# Patient Record
Sex: Female | Born: 1970
Health system: Southern US, Community
[De-identification: ages and names within clinical notes are randomized; demographics above are authoritative.]

## PROBLEM LIST (undated history)

## (undated) DIAGNOSIS — C44511 Basal cell carcinoma of skin of breast: Secondary | ICD-10-CM

## (undated) DIAGNOSIS — J45909 Unspecified asthma, uncomplicated: Secondary | ICD-10-CM

## (undated) DIAGNOSIS — K219 Gastro-esophageal reflux disease without esophagitis: Secondary | ICD-10-CM

## (undated) DIAGNOSIS — E039 Hypothyroidism, unspecified: Secondary | ICD-10-CM

## (undated) DIAGNOSIS — J302 Other seasonal allergic rhinitis: Secondary | ICD-10-CM

## (undated) DIAGNOSIS — E079 Disorder of thyroid, unspecified: Secondary | ICD-10-CM

## (undated) HISTORY — PX: OTHER SURGICAL HISTORY: SHX169

## (undated) HISTORY — PX: CLAVICLE SURGERY: SHX598

## (undated) HISTORY — PX: TONSILLECTOMY: SUR1361

## (undated) HISTORY — DX: Disorder of thyroid, unspecified: E07.9

## (undated) HISTORY — PX: CHOLECYSTECTOMY: SHX55

## (undated) HISTORY — PX: NASAL SINUS SURGERY: SHX719

## (undated) HISTORY — PX: BREAST ENHANCEMENT SURGERY: SHX7

## (undated) HISTORY — PX: AUGMENTATION MAMMAPLASTY: SUR837

---

## 2001-11-30 ENCOUNTER — Emergency Department (HOSPITAL_COMMUNITY): Admission: EM | Admit: 2001-11-30 | Discharge: 2001-11-30 | Payer: Self-pay | Admitting: *Deleted

## 2002-08-08 ENCOUNTER — Encounter: Payer: Self-pay | Admitting: *Deleted

## 2002-08-08 ENCOUNTER — Emergency Department (HOSPITAL_COMMUNITY): Admission: EM | Admit: 2002-08-08 | Discharge: 2002-08-08 | Payer: Self-pay | Admitting: *Deleted

## 2002-11-16 ENCOUNTER — Encounter: Payer: Self-pay | Admitting: Obstetrics & Gynecology

## 2002-11-16 ENCOUNTER — Encounter: Admission: RE | Admit: 2002-11-16 | Discharge: 2002-11-16 | Payer: Self-pay | Admitting: Obstetrics & Gynecology

## 2003-11-19 ENCOUNTER — Encounter: Admission: RE | Admit: 2003-11-19 | Discharge: 2003-11-19 | Payer: Self-pay | Admitting: Internal Medicine

## 2003-12-12 ENCOUNTER — Other Ambulatory Visit: Admission: RE | Admit: 2003-12-12 | Discharge: 2003-12-12 | Payer: Self-pay | Admitting: Obstetrics and Gynecology

## 2004-02-06 ENCOUNTER — Ambulatory Visit (HOSPITAL_BASED_OUTPATIENT_CLINIC_OR_DEPARTMENT_OTHER): Admission: RE | Admit: 2004-02-06 | Discharge: 2004-02-06 | Payer: Self-pay | Admitting: Otolaryngology

## 2004-02-06 ENCOUNTER — Ambulatory Visit (HOSPITAL_COMMUNITY): Admission: RE | Admit: 2004-02-06 | Discharge: 2004-02-06 | Payer: Self-pay | Admitting: Otolaryngology

## 2004-02-06 ENCOUNTER — Encounter (INDEPENDENT_AMBULATORY_CARE_PROVIDER_SITE_OTHER): Payer: Self-pay | Admitting: *Deleted

## 2004-02-21 ENCOUNTER — Emergency Department (HOSPITAL_COMMUNITY): Admission: EM | Admit: 2004-02-21 | Discharge: 2004-02-22 | Payer: Self-pay | Admitting: Emergency Medicine

## 2004-12-27 ENCOUNTER — Encounter
Admission: RE | Admit: 2004-12-27 | Discharge: 2004-12-27 | Payer: Self-pay | Admitting: Physical Medicine and Rehabilitation

## 2005-05-29 ENCOUNTER — Inpatient Hospital Stay (HOSPITAL_COMMUNITY): Admission: AD | Admit: 2005-05-29 | Discharge: 2005-05-29 | Payer: Self-pay | Admitting: *Deleted

## 2007-01-06 ENCOUNTER — Encounter
Admission: RE | Admit: 2007-01-06 | Discharge: 2007-01-06 | Payer: Self-pay | Admitting: Physical Medicine and Rehabilitation

## 2007-08-15 ENCOUNTER — Ambulatory Visit (HOSPITAL_COMMUNITY): Admission: RE | Admit: 2007-08-15 | Discharge: 2007-08-15 | Payer: Self-pay | Admitting: Gastroenterology

## 2007-10-10 ENCOUNTER — Encounter (INDEPENDENT_AMBULATORY_CARE_PROVIDER_SITE_OTHER): Payer: Self-pay | Admitting: General Surgery

## 2007-10-10 ENCOUNTER — Ambulatory Visit (HOSPITAL_COMMUNITY): Admission: RE | Admit: 2007-10-10 | Discharge: 2007-10-11 | Payer: Self-pay | Admitting: General Surgery

## 2010-04-19 ENCOUNTER — Encounter: Payer: Self-pay | Admitting: Obstetrics & Gynecology

## 2010-08-11 NOTE — Op Note (Signed)
NAMESONIA, BROMELL              ACCOUNT NO.:  [id number]   MEDICAL RECORD NO.:  [id number]          PATIENT TYPE:  OIB   LOCATION:  0098                         FACILITY:  [facility]   PHYSICIAN:  Anselm Pancoast. Weatherly, M.D.DATE OF BIRTH:  06-13-1970   DATE OF PROCEDURE:  10/10/2007  DATE OF DISCHARGE:                               OPERATIVE REPORT   PREOPERATIVE DIAGNOSIS:  Biliary dyskinesia.   POSTOPERATIVE DIAGNOSIS:  Biliary dyskinesia.   OPERATION:  Laparoscopic cholecystectomy with cholangiogram.   SURGEON:  Anselm Pancoast. Zachery Dakins, M.D.   ASSISTANT:  Angelia Mould. Derrell Lolling, M.D.   General anesthesia.   HISTORY:  Kristy Walter is a 40 year old patient female who was  referred to me by Dr. Jeani Hawking for a symptomatic gallbladder.  The  patient has had a long history of esophageal reflux or those type  symptoms, but more recently, she has had more pain in the right upper  quadrant and had an ultrasound that did not show gallstones.  She had a  hepatobiliary scan with half and half stimulation that reproduced her  symptoms and had a low-ejection fraction.  I was asked to see her after  this test was done, and on listening to her symptoms, she said certainly  convinced me that it is certainly sounds as if it is biliary in nature,  and I was in agreement to proceed with the laparoscopic cholecystectomy  with cholangiogram.  She elected to have her gallbladder removed, and  whether her symptoms will be completely resolve or whether she will  still have the symptoms of esophageal reflux I am not sure.  The patient  is on Protonix daily, and her preoperative CBC and CMET were all within  normal limits.  The patient preoperatively was given 3 g of Unasyn.  She  has PAS stockings and was taken to the operative suite.  After the  abdomen had been prepped and induction general anesthesia and  endotracheal tube and an oral tube to the stomach, the abdomen was  draped in a sterile manner,  and a small incision was made below the  umbilicus.  The fascia was identified, picked up between 2 Kocher's and  very carefully opened through the fascia and in the peritoneum I  carefully entered through with a hemostat.  Pursestring suture of 0  Vicryl was placed and Hasson cannula introduced.  The gallbladder was  not acutely inflamed.  It was distended, and she has definitely has  adhesions around it.  The upper 10-mm trocar was placed under direct  vision in the subxiphoid area, and two lateral 5-mm trocars were placed  by Dr. Derrell Lolling with the appropriate position after anesthetizing the  fascia.  The adhesions to the gallbladder were carefully taken down, and  you could see where the gallbladder was located as it was kind of  kinking on itself, and there was very a marked angulation of the cystic  duct which was very short in nature.  The cystic artery was identified.  There was a little bit of bleeding as I went around it posteriorly, and  I put 2 clips  proximally, singly and distally and then the bleeding  appeared to be in control.  I then used the right-angle to carefully go  around the junction of the gallbladder cystic duct and put a clip on the  cystic duct gallbladder junction.  A small opening was made proximally.  The catheter was introduced, held in place with a clip, and the x-ray  was obtained.  There was good prompt filling of the extra biliary system  and appears that we got about a centimeter of cystic duct.  I removed  the catheter and put 3 clips on the cystic duct and then divided the  cystic duct distal to the 3 clips, then divided the anterior branch of  the cystic artery.  The dissection very carefully, and I think I  identified the posterior branch.  It was also clipped, and then when  this freed, you could remove the gallbladder easily.  Good hemostasis  was obtained.  The gallbladder was placed in the EndoCatch bag and  brought out within the bag with  withdrawing the Hassan cannula with the  camera and the upper 10-mm trocar.  I put my finger in the fascia.  We  irrigated, aspirated.  There was good hemostasis.  No evidence of any  bleeding or bile, and then I put an additional figure-of-eight of 0  Vicryl in the fascia at the umbilicus and anesthetized the fascia at the  umbilicus with Marcaine.  The other ports had been anesthetized with  insertion of the trocars.  Carbon dioxide was released after the  irrigating fluid had been withdrawn, and the subcutaneous wounds were  closed with 4-0 Vicryl.  Benzoin and Steri-Strips on the skin.  The  patient tolerated the procedure nicely and was sent to recovery room in  stable postoperative condition.  The patient wants to spend the night.  She will be able to go home tomorrow and should be able to return to  work in about 2 weeks or sooner if she desires.           ______________________________  Anselm Pancoast. Zachery Dakins, M.D.     WJW/MEDQ  D:  10/10/2007  T:  10/10/2007  Job:  [id number]   cc:   Anselm Pancoast. Zachery Dakins, M.D.  [address], [address]  [place]  [place] [address]   Kristy Walter, M.D.  Fax: [phone]

## 2010-08-14 NOTE — Op Note (Signed)
NAMEDAYTON, SHERR NO.:  [id number]   MEDICAL RECORD NO.:  [id number]          PATIENT TYPE:  AMB   LOCATION:  DSC                          FACILITY:  [facility]   PHYSICIAN:  Suzanna Obey, M.D.       DATE OF BIRTH:  02/20/1971   DATE OF PROCEDURE:  02/06/2004  DATE OF DISCHARGE:                                 OPERATIVE REPORT   PREOPERATIVE DIAGNOSIS:  Deviated septum, turbinate hypertrophy and  tonsillitis.   POSTOPERATIVE DIAGNOSIS:  Deviated septum, turbinate hypertrophy and  tonsillitis.   OPERATION PERFORMED:  Septoplasty, submucous resection of inferior  turbinates, and tonsillectomy.   SURGEON:  Suzanna Obey, M.D.   ANESTHESIA:  General endotracheal.   ESTIMATED BLOOD LOSS:  Approximately 25 mL.   INDICATIONS FOR PROCEDURE:  The patient is a 40 year old who has had chronic  nasal obstruction that has been refractory to medical therapy.  She has  significant issues with her nasal obstruction and has not resolved with  nasal steroid sprays.  She also has chronic tonsillitis with repetitive  episodes and cryptic debris which is very bothersome.  She was informed of  the risks and benefits of the procedure, including bleeding, infection,  septal perforation, change in external appearance of the nose with saddle  nose deformity or tip ptosis, chronic crusting and drying, velopharyngeal  insufficiency, change in the voice, chronic pain and risks of the  anesthetic.  All questions were answered and consent was obtained.   DESCRIPTION OF PROCEDURE:  The patient was taken to the operating room and  placed in supine position.  After adequate general endotracheal tube  anesthesia, was prepped and draped in the usual sterile manner.  Oxymetazoline pledgets were placed into the nose bilaterally and then the  septum and inferior turbinates were injected with 1% lidocaine with  1:100,000 epinephrine.  A left hemitransfixion incision was performed  raising a  mucoperichondrial and mucoperiosteal flap.  The cartilage was  removed which was significantly deviated to the left.  This was removed 2 cm  posterior to the caudal strut.  The deviated portion of the bone was removed  after elevating the opposite flap and removed with a Jansen-Middleton  forceps.  The inferior spur was removed with a 4 mm osteotome.  This  corrected the septal deflection.  The turbinates were infractured, midline  incision made with a 15 blade and mucosal flap elevated superiorly and  inferior mucosa and bone were removed with turbinate scissors.  The right  side turbinate was actually fairly reduced already and there was a polyp in  the posterior aspect that was cauterized away.  The hemitransfixion closed  with interrupted 4-0 chromic and a 4-0 plain gut placed to the septum.  Packing Telfa roll soaked in bacitracin placed into the nose bilaterally and  secured with a 3-0 nylon.  Table was turned, patient was placed in Rose  position and draped in sterile fashion.  Crowe-Davis mouth gag was inserted,  retracted and suspended from the Mayo stand.  The left tonsil was begun  making a left at anterior tonsillar pillar incision identifying the capsule  of the tonsil and removing it with electrocautery dissection.  Right tonsil  removed in  the same fashion.  Hemostasis achieved with suction cautery.  Hypopharynx,  esophagus and stomach were suctioned with a nasogastric tube.  Crowe-Davis  mouth gag was released and resuspended.  Hemostasis was present in all  locations.  The patient was awakened and brought to recovery in stable  condition.  Counts correct.       JB/MEDQ  D:  02/06/2004  T:  02/06/2004  Job:  [id number]

## 2010-09-15 ENCOUNTER — Other Ambulatory Visit: Payer: Self-pay | Admitting: Family Medicine

## 2010-09-15 ENCOUNTER — Ambulatory Visit
Admission: RE | Admit: 2010-09-15 | Discharge: 2010-09-15 | Disposition: A | Discharge status: Home / Self Care | Payer: 59 | Source: Ambulatory Visit | Attending: Family Medicine | Admitting: Family Medicine

## 2010-09-15 DIAGNOSIS — R519 Headache, unspecified: Secondary | ICD-10-CM

## 2010-09-24 ENCOUNTER — Other Ambulatory Visit: Payer: Self-pay | Admitting: Internal Medicine

## 2010-09-24 DIAGNOSIS — E041 Nontoxic single thyroid nodule: Secondary | ICD-10-CM

## 2010-10-06 ENCOUNTER — Other Ambulatory Visit: Payer: 59

## 2010-12-03 ENCOUNTER — Ambulatory Visit: Payer: 59

## 2010-12-11 ENCOUNTER — Ambulatory Visit: Payer: 59 | Attending: Neurology | Admitting: Physical Therapy

## 2010-12-11 DIAGNOSIS — IMO0001 Reserved for inherently not codable concepts without codable children: Secondary | ICD-10-CM | POA: Insufficient documentation

## 2010-12-11 DIAGNOSIS — M2560 Stiffness of unspecified joint, not elsewhere classified: Secondary | ICD-10-CM | POA: Insufficient documentation

## 2010-12-11 DIAGNOSIS — M6281 Muscle weakness (generalized): Secondary | ICD-10-CM | POA: Insufficient documentation

## 2010-12-11 DIAGNOSIS — M25519 Pain in unspecified shoulder: Secondary | ICD-10-CM | POA: Insufficient documentation

## 2010-12-11 DIAGNOSIS — M542 Cervicalgia: Secondary | ICD-10-CM | POA: Insufficient documentation

## 2010-12-14 ENCOUNTER — Encounter: Payer: 59 | Admitting: Physical Therapy

## 2010-12-16 ENCOUNTER — Ambulatory Visit: Payer: 59 | Admitting: Physical Therapy

## 2010-12-17 ENCOUNTER — Other Ambulatory Visit: Payer: Self-pay | Admitting: Obstetrics and Gynecology

## 2010-12-17 ENCOUNTER — Ambulatory Visit: Payer: 59 | Admitting: Physical Therapy

## 2010-12-17 ENCOUNTER — Ambulatory Visit: Payer: 59

## 2010-12-17 DIAGNOSIS — Z1231 Encounter for screening mammogram for malignant neoplasm of breast: Secondary | ICD-10-CM

## 2010-12-22 ENCOUNTER — Ambulatory Visit: Payer: 59 | Admitting: Physical Therapy

## 2010-12-24 LAB — CBC
HCT: 37.7
Hemoglobin: 12.7
MCHC: 33.7
MCV: 88.6
Platelets: 323
RBC: 4.26
RDW: 12.6
WBC: 6.5

## 2010-12-24 LAB — COMPREHENSIVE METABOLIC PANEL
ALT: 17
AST: 25
Albumin: 4
Alkaline Phosphatase: 47
BUN: 10
CO2: 27
Calcium: 9.2
Chloride: 101
Creatinine, Ser: 0.68
GFR calc Af Amer: >60
GFR calc non Af Amer: >60
Glucose, Bld: 105 — ABNORMAL HIGH
Potassium: 4.2
Sodium: 136
Total Bilirubin: 0.8
Total Protein: 6.8

## 2010-12-24 LAB — DIFFERENTIAL
Basophils Absolute: 0
Basophils Relative: 1
Eosinophils Absolute: 0.1
Eosinophils Relative: 1
Lymphocytes Relative: 25
Lymphs Abs: 1.6
Monocytes Absolute: 0.5
Monocytes Relative: 8
Neutro Abs: 4.2
Neutrophils Relative %: 65

## 2010-12-24 LAB — PREGNANCY, URINE: Preg Test, Ur: NEGATIVE

## 2010-12-28 ENCOUNTER — Encounter: Payer: 59 | Admitting: Physical Therapy

## 2010-12-29 ENCOUNTER — Ambulatory Visit: Payer: 59 | Attending: Neurology | Admitting: Physical Therapy

## 2010-12-29 DIAGNOSIS — M25519 Pain in unspecified shoulder: Secondary | ICD-10-CM | POA: Insufficient documentation

## 2010-12-29 DIAGNOSIS — M6281 Muscle weakness (generalized): Secondary | ICD-10-CM | POA: Insufficient documentation

## 2010-12-29 DIAGNOSIS — M542 Cervicalgia: Secondary | ICD-10-CM | POA: Insufficient documentation

## 2010-12-29 DIAGNOSIS — IMO0001 Reserved for inherently not codable concepts without codable children: Secondary | ICD-10-CM | POA: Insufficient documentation

## 2010-12-29 DIAGNOSIS — M2560 Stiffness of unspecified joint, not elsewhere classified: Secondary | ICD-10-CM | POA: Insufficient documentation

## 2010-12-31 ENCOUNTER — Encounter: Payer: 59 | Admitting: Physical Therapy

## 2011-01-04 ENCOUNTER — Ambulatory Visit: Payer: 59

## 2011-01-05 ENCOUNTER — Ambulatory Visit: Payer: 59 | Admitting: Physical Therapy

## 2011-01-08 ENCOUNTER — Ambulatory Visit: Payer: 59

## 2011-01-08 ENCOUNTER — Ambulatory Visit: Payer: 59 | Admitting: Physical Therapy

## 2011-01-11 ENCOUNTER — Encounter: Payer: 59 | Admitting: Physical Therapy

## 2011-01-14 ENCOUNTER — Encounter: Payer: 59 | Admitting: Physical Therapy

## 2011-07-01 ENCOUNTER — Other Ambulatory Visit: Payer: Self-pay | Admitting: Neurology

## 2011-07-01 DIAGNOSIS — M5481 Occipital neuralgia: Secondary | ICD-10-CM

## 2011-07-01 DIAGNOSIS — S161XXA Strain of muscle, fascia and tendon at neck level, initial encounter: Secondary | ICD-10-CM

## 2011-07-01 DIAGNOSIS — IMO0002 Reserved for concepts with insufficient information to code with codable children: Secondary | ICD-10-CM

## 2011-07-01 DIAGNOSIS — S139XXA Sprain of joints and ligaments of unspecified parts of neck, initial encounter: Secondary | ICD-10-CM

## 2011-07-06 ENCOUNTER — Ambulatory Visit
Admission: RE | Admit: 2011-07-06 | Discharge: 2011-07-06 | Disposition: A | Discharge status: Home / Self Care | Payer: BC Managed Care – PPO | Source: Ambulatory Visit | Attending: Neurology | Admitting: Neurology

## 2011-07-06 ENCOUNTER — Other Ambulatory Visit: Payer: Self-pay | Admitting: Neurology

## 2011-07-06 DIAGNOSIS — M5481 Occipital neuralgia: Secondary | ICD-10-CM

## 2011-07-06 DIAGNOSIS — S161XXA Strain of muscle, fascia and tendon at neck level, initial encounter: Secondary | ICD-10-CM

## 2011-07-06 DIAGNOSIS — S139XXA Sprain of joints and ligaments of unspecified parts of neck, initial encounter: Secondary | ICD-10-CM

## 2011-07-06 DIAGNOSIS — IMO0002 Reserved for concepts with insufficient information to code with codable children: Secondary | ICD-10-CM

## 2011-07-06 MED ORDER — DEXAMETHASONE SODIUM PHOSPHATE 4 MG/ML IJ SOLN
4.0000 mg | Freq: Once | INTRAMUSCULAR | Status: AC
  Administered 2011-07-06: 4 mg

## 2011-07-06 NOTE — Discharge Instructions (Signed)

## 2011-07-16 ENCOUNTER — Other Ambulatory Visit: Payer: Self-pay | Admitting: Neurology

## 2011-07-16 DIAGNOSIS — M5481 Occipital neuralgia: Secondary | ICD-10-CM

## 2011-07-20 ENCOUNTER — Emergency Department (INDEPENDENT_AMBULATORY_CARE_PROVIDER_SITE_OTHER)
Admission: EM | Admit: 2011-07-20 | Discharge: 2011-07-20 | Disposition: A | Discharge status: Home / Self Care | Payer: BC Managed Care – PPO | Source: Home / Self Care | Attending: Emergency Medicine | Admitting: Emergency Medicine

## 2011-07-20 ENCOUNTER — Encounter (HOSPITAL_COMMUNITY): Payer: Self-pay | Admitting: Emergency Medicine

## 2011-07-20 DIAGNOSIS — A09 Infectious gastroenteritis and colitis, unspecified: Secondary | ICD-10-CM

## 2011-07-20 HISTORY — DX: Other seasonal allergic rhinitis: J30.2

## 2011-07-20 LAB — POCT URINALYSIS DIP (DEVICE)
Bilirubin Urine: NEGATIVE
Glucose, UA: NEGATIVE mg/dL
Hgb urine dipstick: NEGATIVE
Ketones, ur: NEGATIVE mg/dL
Nitrite: NEGATIVE
Protein, ur: NEGATIVE mg/dL
Specific Gravity, Urine: 1.01 (ref 1.005–1.030)
Urobilinogen, UA: 0.2 mg/dL (ref 0.0–1.0)
pH: 6 (ref 5.0–8.0)

## 2011-07-20 LAB — POCT PREGNANCY, URINE: Preg Test, Ur: NEGATIVE

## 2011-07-20 MED ORDER — CIPROFLOXACIN HCL 500 MG PO TABS: 500.0000 mg | ORAL_TABLET | Freq: Two times a day (BID) | ORAL | Status: AC

## 2011-07-20 NOTE — ED Provider Notes (Signed)
History     CSN: [id number]  Arrival date & time 07/20/11  1810   First MD Initiated Contact with Patient 07/20/11 1855      Chief Complaint  Patient presents with  . Abdominal Pain    (Consider location/radiation/quality/duration/timing/severity/associated sxs/prior treatment) HPI Comments: Patient reports crampy, diffuse abdominal pain followed by watery, nonbloody diarrhea starting 5 days ago. Reports approximately 10 episodes of diarrhea a day. Patient recently returned from the [place], where she did drink beverages with ice. Also did a lot of swimming in a pool. No raw or undercooked foods. 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 Patient states her diarrhea started prior to starting the medication for the yeast infection. Patient currently has no vaginal complaints. Patient has not taking any other antibiotics. No abdominal distention, foul-smelling flatulence, rectal bleeding, fevers, stools floating on top of the toilet water. No nausea or vomiting. No urinary complaints, decreased urine output. Her symptoms are better with fasting, and worse with by mouth intake. Patient has not tried anything for her symptoms. Patient has a history of cholecystectomy, but no other abdominal surgeries.  ROS as noted in HPI. All other ROS negative.   Patient is a 41 y.o. female presenting with abdominal pain. The history is provided by the patient. No language interpreter was used.  Abdominal Pain The primary symptoms of the illness include abdominal pain. The current episode started 2 days ago. The onset of the illness was sudden. The problem has not changed since onset. The illness is associated with recent travel. The patient states that she believes she is currently not pregnant. The patient has had a change in bowel habit. Symptoms associated with the illness do not include chills,  anorexia, constipation, urgency, hematuria or frequency.    Past Medical History  Diagnosis Date  . Seasonal allergies     Past Surgical History  Procedure Date  . Cholecystectomy   . Tonsillectomy   . Nasal sinus surgery   . Clavicle surgery   . Breast enhancement surgery     History reviewed. No pertinent family history.  History  Substance Use Topics  . Smoking status: Never Smoker   . Smokeless tobacco: Never Used  . Alcohol Use: Yes    OB History    Grav Para Term Preterm Abortions TAB SAB Ect Mult Living                  Review of Systems  Constitutional: Negative for chills.  Gastrointestinal: Positive for abdominal pain. Negative for constipation and anorexia.  Genitourinary: Negative for urgency, frequency and hematuria.    Allergies  Sulfa antibiotics  Home Medications   Current Outpatient Rx  Name Route Sig Dispense Refill  . CETIRIZINE HCL 10 MG PO TABS Oral Take 10 mg by mouth daily.    Marland Kitchen MONTELUKAST SODIUM 10 MG PO TABS Oral Take 10 mg by mouth at bedtime.    Marland Kitchen CIPROFLOXACIN HCL 500 MG PO TABS Oral Take 1 tablet (500 mg total) by mouth 2 (two) times daily. X 10 days 10 tablet 0    BP 127/80  Pulse 69  Temp(Src) 97.8 F (36.6 C) (Oral)  Resp 17  SpO2 95%  LMP 06/28/2011  Physical Exam  Nursing note and vitals reviewed. Constitutional: She is oriented to person, place, and time. She appears well-developed and well-nourished. No distress.  HENT:  Head: Normocephalic and atraumatic.  Eyes:  Conjunctivae and EOM are normal.  Neck: Normal range of motion.  Cardiovascular: Normal rate, regular rhythm, normal heart sounds and intact distal pulses.   Pulmonary/Chest: Effort normal and breath sounds normal.  Abdominal: Soft. Normal appearance and bowel sounds are normal. She exhibits no distension. There is generalized tenderness. There is no rebound, no guarding and no CVA tenderness.       Mild diffuse tenderness.  Musculoskeletal: Normal  range of motion.  Neurological: She is alert and oriented to person, place, and time.  Skin: Skin is warm and dry.  Psychiatric: She has a normal mood and affect. Her behavior is normal. Judgment and thought content normal.    ED Course  Procedures (including critical care time)  Labs Reviewed  POCT URINALYSIS DIP (DEVICE) - Abnormal; Notable for the following:    Leukocytes, UA TRACE (*) Biochemical Testing Only. Please order routine urinalysis from main lab if confirmatory testing is needed.   All other components within normal limits  POCT PREGNANCY, URINE   No results found.   1. Diarrhea, travelers'     Results for orders placed during the hospital encounter of 07/20/11  POCT URINALYSIS DIP (DEVICE)      Component Value Range   Glucose, UA NEGATIVE  NEGATIVE (mg/dL)   Bilirubin Urine NEGATIVE  NEGATIVE    Ketones, ur NEGATIVE  NEGATIVE (mg/dL)   Specific Gravity, Urine 1.010  1.005 - 1.030    Hgb urine dipstick NEGATIVE  NEGATIVE    pH 6.0  5.0 - 8.0    Protein, ur NEGATIVE  NEGATIVE (mg/dL)   Urobilinogen, UA 0.2  0.0 - 1.0 (mg/dL)   Nitrite NEGATIVE  NEGATIVE    Leukocytes, UA TRACE (*) NEGATIVE   POCT PREGNANCY, URINE      Component Value Range   Preg Test, Ur NEGATIVE  NEGATIVE      MDM  Patient appears well hydrated. Abdomen benign. Udip noted. No urinary complaints. Given history of recent travel, suspect travelers diarrhea. No fevers at home, afebrile here. Will send home with Cipro 500 mg twice a day x5 days. She is to followup with her primary care physician if no improvement.  Luiz Blare, MD 07/20/11 2204

## 2011-07-20 NOTE — Discharge Instructions (Signed)
Travelers' Diarrhea Travelers' diarrhea (TD) is the most common illness affecting travelers. Each year many travelers develop diarrhea. TD usually occurs within the first week of travel. However, it may occur at any time while traveling. It may even occur after returning home. The most important risk factor is where you are going. High-risk places are the developing countries of:  [place].   [place].   The [place].   [place].  High risk people include young adults and those with:  Transplants.   HIV infections.   Medicine that suppresses the immune system.   Inflammatory-bowel disease.   Diabetes.   H-2 blockers or antacids.  Attack rates are similar for men and women. The primary source of TD is eating or drinking food or water tainted with feces (stool or bowel movements). CAUSES  Infectious agents are the primary cause of TD. Germs cause almost 80% of TD cases. The most common germ produces:  Watery diarrhea with cramps.   Low-grade or no fever.  There are many other bacterial, viral and parasitic pathogens (disease causing "bugs").  SYMPTOMS  Most TD cases begin suddenly. Symptoms include stool that is increased in:  Frequency.   Volume.   Weight.  Altered stool consistency also is common. Typically, you have four to five loose or watery bowel movements each day. Other common symptoms are:  Nausea.   Vomiting.   Diarrhea.   Abdominal cramping.   Bloating.   Fever.   Urgency.   Malaise.  Most cases are not dangerous. Most cases go away in 1-2 days without treatment. TD is rarely life threatening. 90% of cases resolve within 1 week. 98% resolve within 1 month. PREVENTION   Avoid foods or beverages purchased from street vendors in high risk countries.   Avoid food from places where unclean conditions are present.   Avoid raw or undercooked meat and seafood.   Avoid raw fruits (e.g., oranges, bananas, avocados) and vegetables unless you peel them  yourself.   If handled properly, well-cooked and packaged foods usually are safe. Foods associated with increased risk for TD include:   Tap water.   Ice.   Unpasteurized milk.   Dairy products.   Safe beverages include:   Bottled carbonated beverages.   Hot tea or coffee.   Beer.   Wine.   Boiled water.   Water treated with iodine or chlorine.  ANTIBIOTICS ARE NOT RECOMMENDED AS PREVENTION  CDC (Centers for Disease Control) does not recommend antimicrobial drugs (medicine that kill germs) to prevent TD. Several studies show that Pepto-Bismol taken as either 2 tablets 4 times daily, or 2 fluid ounces 4 times daily, reduces the incidence of travelers' diarrhea. People that should avoid Pepto-Bismol include those who are:   Pregnant.   Allergic to aspirin.   Taking anticoagulants medicine (probenecid, methotrexate).   Be informed about potential side effects, in particular about temporary blackening of the tongue and stool, and rarely ringing in the ears. Because of potential adverse side effects, preventative Pepto-Bismol should not be used for more than 3 weeks.   Some antibiotics taken in a once-a-day dose are 90% effective at preventing travelers' diarrhea. However, antibiotics are not recommended as prevention. Routine antimicrobial prophylaxis increases your risk for:   Adverse reactions.   Infections with resistant organisms.   Antibiotics can increase your susceptibility to resistant bacterial pathogens and provide no protection against either viral or parasitic pathogens. This can give travelers a false sense of security. As a result, strict adherence   to preventive measures is encouraged. Pepto-Bismol should be used as an extra effort if prophylaxis is needed.  TREATMENT   TD usually is a self-limited disorder. It gets well without treatment. It often goes away without specific treatment. Oral re-hydration is often helpful to replace lost fluids and  electrolytes. Clear liquids are routinely recommended for adults. You may be helped with antimicrobial therapy if you develop three or more loose stools in an 8-hour period, especially if associated with:   Nausea.   Vomiting.   Abdominal cramps.   Fever.   Blood in stools.   Antibiotics usually are given for 3-5 days. Pepto-Bismol also may be used as treatment. Take one fluid ounce, or two 262 mg tablets every 30 minutes, for up to 8 doses in a 24-hour period. This can be repeated on a second day. If diarrhea persists despite therapy, you should be evaluated by a caregiver and treated for possible parasitic infection.   Because drug resistance is a continuing problem and may vary from country to country, professional assistance should be looked for if problems persist.   Antimotility agents (loperamide, diphenoxylate, and paregoric) mostly reduce diarrhea by slowing down the passage of food and drink in the gut. This allows more time for absorption. Some persons believe diarrhea is the body's defense mechanism to minimize contact time between gut pathogens and lining of the bowel. In several studies, antimotility agents have been useful in treating travelers' diarrhea by decreasing the duration of diarrhea. However, these agents should never be used by persons with fever or bloody diarrhea because they can increase the severity of disease by delaying clearance of causative organisms. Because antimotility agents are now available over the counter, their improper use is of concern. Complications have been reported from the use of these medicines such as:   Toxic megacolon.   Sepsis.   Disseminated intravascular coagulation.  SEEK IMMEDIATE MEDICAL CARE IF:   You are unable to keep fluids down.   Vomiting or diarrhea becomes persistent.   Abdominal (belly) pain develops or increases or localizes. (Right sided pain can be appendicitis and left sided pain in adults can be diverticulitis).     You develop an oral temperature above 102 F (38.9 C), or as your caregiver suggests.   Diarrhea becomes excessive or contains blood or mucous.   Excessive weakness, dizziness, fainting or extreme thirst.   Checking weight 2 to 3 times per day in babies and children will help verify adequate fluid replacement. Your caregiver will tell you what loss should concern you or suggest another visit to your personal physician.   Record your weight or your child's weight today. Compare this to your home scale and record all weights and time and date weighed. Try to check weight at the same times every day. Bring this chart to your caregivers if you or your child needs to be seen again.  FOR MORE INFORMATION  Travelers should consult with a caregiver before departing on a trip abroad. Information about TD is available from:  Your local or state health departments.   World Health Organization (WHO).  Other information that may be of interest to travelers can be found at the CDC Travelers' Health homepage at http://www.cdc.gov/travel. Document Released: 03/05/2002 Document Revised: 03/04/2011 Document Reviewed: 05/23/2008 Exit[employer] Patient Information 2012 [employer]. 

## 2011-07-20 NOTE — ED Notes (Signed)
Pt. Stated, I've had stomach pain for 5 days , I was out of the country in [place] and developed a yeast infection while there.  The pain is almost constant it comes and goes really bad, diarrhea 5-10 times a day.  Pain is on the left side.

## 2011-07-22 ENCOUNTER — Other Ambulatory Visit: Payer: Self-pay | Admitting: Neurology

## 2011-07-22 DIAGNOSIS — M5481 Occipital neuralgia: Secondary | ICD-10-CM

## 2011-08-04 ENCOUNTER — Other Ambulatory Visit: Payer: Self-pay | Admitting: Neurology

## 2011-08-04 ENCOUNTER — Ambulatory Visit
Admission: RE | Admit: 2011-08-04 | Discharge: 2011-08-04 | Disposition: A | Discharge status: Home / Self Care | Payer: BC Managed Care – PPO | Source: Ambulatory Visit | Attending: Neurology | Admitting: Neurology

## 2011-08-04 DIAGNOSIS — M5481 Occipital neuralgia: Secondary | ICD-10-CM

## 2011-08-04 MED ORDER — DEXAMETHASONE SODIUM PHOSPHATE 4 MG/ML IJ SOLN
4.0000 mg | Freq: Once | INTRAMUSCULAR | Status: AC
  Administered 2011-08-04: 4 mg

## 2011-08-11 ENCOUNTER — Ambulatory Visit
Admission: RE | Admit: 2011-08-11 | Discharge: 2011-08-11 | Disposition: A | Discharge status: Home / Self Care | Payer: BC Managed Care – PPO | Source: Ambulatory Visit | Attending: Neurology | Admitting: Neurology

## 2011-08-11 ENCOUNTER — Other Ambulatory Visit: Payer: Self-pay | Admitting: Neurology

## 2011-08-11 DIAGNOSIS — M5481 Occipital neuralgia: Secondary | ICD-10-CM

## 2011-08-13 ENCOUNTER — Other Ambulatory Visit: Payer: Self-pay | Admitting: Neurology

## 2011-08-13 DIAGNOSIS — M5481 Occipital neuralgia: Secondary | ICD-10-CM

## 2011-10-12 ENCOUNTER — Ambulatory Visit
Admission: RE | Admit: 2011-10-12 | Discharge: 2011-10-12 | Disposition: A | Discharge status: Home / Self Care | Payer: BC Managed Care – PPO | Source: Ambulatory Visit | Attending: Neurology | Admitting: Neurology

## 2011-10-12 ENCOUNTER — Other Ambulatory Visit: Payer: Self-pay | Admitting: Neurology

## 2011-10-12 VITALS — BP 113/61 | HR 63

## 2011-10-12 DIAGNOSIS — M5481 Occipital neuralgia: Secondary | ICD-10-CM

## 2011-10-12 MED ORDER — TRIAMCINOLONE ACETONIDE 40 MG/ML IJ SUSP (RADIOLOGY)
60.0000 mg | Freq: Once | INTRAMUSCULAR | Status: AC
  Administered 2011-10-12: 60 mg via EPIDURAL

## 2011-10-12 MED ORDER — IOHEXOL 300 MG/ML  SOLN
1.0000 mL | Freq: Once | INTRAMUSCULAR | Status: AC | PRN
  Administered 2011-10-12: 1 mL via EPIDURAL

## 2011-10-15 ENCOUNTER — Telehealth: Payer: Self-pay

## 2011-10-15 NOTE — Telephone Encounter (Signed)
Patient called this morning (10/15/11) to report unusual symptoms since having CEPI #1 on 10/12/11.  She states she has pain in her ears, a headache and dizziness.  Her neck is puffy and tender "a few inches above injection site."  She has blurred vision and nausea, as well.  Denies fever or positional headache.  States the only thing that helps is "not to move at all."  She also said that with previous three occipital nerve blocks that her symptoms became worse before they got better.  She is hopeful that this is the case with this CEPI.  I shared this information with Dr. Alfredo Batty, since Dr. Benard Rink (who performed CEPI) is off today.  I told patient that Dr. Alfredo Batty said to go ahead and start taking Ibuprofen 800mg  PO that patient states she has, and to take this every eight hours through the weekend.  We will check back with her on Monday, October 18, 2011;  Dr. Benard Rink will be back in the office that day, too.  She understands she is to go to nearest ED if she develops fever or redness/drainage at injection site.

## 2012-06-02 ENCOUNTER — Encounter (HOSPITAL_COMMUNITY): Payer: Self-pay | Admitting: Pharmacist

## 2012-06-05 ENCOUNTER — Other Ambulatory Visit: Payer: Self-pay | Admitting: Obstetrics and Gynecology

## 2012-06-08 ENCOUNTER — Encounter (HOSPITAL_COMMUNITY)
Admission: RE | Admit: 2012-06-08 | Discharge: 2012-06-08 | Disposition: A | Discharge status: Home / Self Care | Payer: 59 | Source: Ambulatory Visit | Attending: Obstetrics and Gynecology | Admitting: Obstetrics and Gynecology

## 2012-06-08 ENCOUNTER — Encounter (HOSPITAL_COMMUNITY): Payer: Self-pay

## 2012-06-08 HISTORY — DX: Unspecified asthma, uncomplicated: J45.909

## 2012-06-08 HISTORY — DX: Gastro-esophageal reflux disease without esophagitis: K21.9

## 2012-06-08 LAB — COMPREHENSIVE METABOLIC PANEL
ALT: 11 U/L (ref 0–35)
AST: 16 U/L (ref 0–37)
Albumin: 4.4 g/dL (ref 3.5–5.2)
Alkaline Phosphatase: 54 U/L (ref 39–117)
BUN: 9 mg/dL (ref 6–23)
CO2: 28 mEq/L (ref 19–32)
Calcium: 9.6 mg/dL (ref 8.4–10.5)
Chloride: 100 mEq/L (ref 96–112)
Creatinine, Ser: 0.68 mg/dL (ref 0.50–1.10)
GFR calc Af Amer: >90 mL/min (ref >90)
GFR calc non Af Amer: >90 mL/min (ref >90)
Glucose, Bld: 98 mg/dL (ref 70–99)
Potassium: 4.7 mEq/L (ref 3.5–5.1)
Sodium: 136 mEq/L (ref 135–145)
Total Bilirubin: 0.7 mg/dL (ref 0.3–1.2)
Total Protein: 7.7 g/dL (ref 6.0–8.3)

## 2012-06-08 LAB — CBC
HCT: 38.2 % (ref 36.0–46.0)
Hemoglobin: 12.8 g/dL (ref 12.0–15.0)
MCH: 29.4 pg (ref 26.0–34.0)
MCHC: 33.5 g/dL (ref 30.0–36.0)
MCV: 87.8 fL (ref 78.0–100.0)
Platelets: 352 10*3/uL (ref 150–400)
RBC: 4.35 MIL/uL (ref 3.87–5.11)
RDW: 11.9 % (ref 11.5–15.5)
WBC: 8.5 10*3/uL (ref 4.0–10.5)

## 2012-06-08 LAB — SURGICAL PCR SCREEN
MRSA, PCR: NEGATIVE
Staphylococcus aureus: NEGATIVE

## 2012-06-08 NOTE — Patient Instructions (Signed)
Your procedure is scheduled on:06/15/12  Enter through the Main Entrance at :1130 am Pick up desk phone and dial [phone] and inform us of your arrival.  Please call [phone] if you have any problems the morning of surgery.  Remember: Do not eat after midnight:Wed. Clear liquids ok until 9am Thursday   Take these meds the morning of surgery with a sip of water:Dexilant  DO NOT wear jewelry, eye make-up, lipstick,body lotion, or dark fingernail polish. Do not shave for 48 hours prior to surgery.  If you are to be admitted after surgery, leave suitcase in car until your room has been assigned. Patients discharged on the day of surgery will not be allowed to drive home.

## 2012-06-14 MED ORDER — SODIUM CHLORIDE 0.9 % IV SOLN
3.0000 g | INTRAVENOUS | Status: AC
  Administered 2012-06-15: 3 g via INTRAVENOUS
  Filled 2012-06-14: qty 3

## 2012-06-14 NOTE — H&P (Signed)
Kristy Walter, Kristy Walter NO.:  [id number]  MEDICAL RECORD NO.:  [id number]  LOCATION:  [facility]                           FACILITY:  [facility]  PHYSICIAN:  Lenoard Aden, M.D.DATE OF BIRTH:  [birth year]  DATE OF ADMISSION:  06/08/2012 DATE OF DISCHARGE:  06/08/2012                             HISTORY & PHYSICAL   CHIEF COMPLAINT:  Dysmenorrhea and menorrhagia.  She is a 42 year old white female, G4, P2, who presents with symptomatic dysmenorrhea, menorrhagia, and uterine fibroids with desire for definitive therapy.  MEDICATIONS:  Include Valtrex p.r.n., Zyrtec p.r.n., Singulair p.r.n.  ALLERGIES:  Sulfa and Keflex.  SOCIAL HISTORY:  She is a nonsmoker, nondrinker.  She denies domestic physical violence.  FAMILY HISTORY:  Alcohol abuse, heart disease, bipolar disorder, drug abuse, ovarian cancer in sisters, lung cancer, and colon cancer.  PAST SURGICAL HISTORY:  Noncontributory.  She has a history of vaginal delivery x2, history of breast augmentation, history of tonsillectomy and nasal surgery.  PHYSICAL EXAMINATION:  GENERAL:  She is a well-developed, well- nourished, white female, in no acute distress. HEENT:  Normal. NECK:  Supple.  Full range of motion. LUNGS:  Clear. HEART:  Regular rhythm. ABDOMEN:  Soft, nontender. PELVIC:  Reveals the uterus to be bulky, irregular, retroverted.  No adnexal masses appreciated.  IMPRESSION:  Symptomatic dysmenorrhea, menorrhagia with symptomatic fibroids for definitive therapy.  PLAN:  Proceed with da Vinci assisted total laparoscopic hysterectomy, bilateral salpingectomy.  Risks of anesthesia, infection, bleeding, injury to abdominal organs, status post delayed versus immediate complications to include bowel and bladder injury noted.  The patient acknowledges and wishes to proceed.     Lenoard Aden, M.D.     RJT/MEDQ  D:  06/14/2012  T:  06/14/2012  Job:  [id number]

## 2012-06-15 ENCOUNTER — Ambulatory Visit (HOSPITAL_COMMUNITY): Payer: 59 | Admitting: Anesthesiology

## 2012-06-15 ENCOUNTER — Encounter (HOSPITAL_COMMUNITY)
Admission: RE | Disposition: A | Discharge status: Home / Self Care | Payer: Self-pay | Source: Ambulatory Visit | Attending: Obstetrics and Gynecology

## 2012-06-15 ENCOUNTER — Encounter (HOSPITAL_COMMUNITY): Payer: Self-pay | Admitting: Anesthesiology

## 2012-06-15 ENCOUNTER — Ambulatory Visit (HOSPITAL_COMMUNITY)
Admission: RE | Admit: 2012-06-15 | Discharge: 2012-06-16 | Disposition: A | Discharge status: Home / Self Care | Payer: 59 | Source: Ambulatory Visit | Attending: Obstetrics and Gynecology | Admitting: Obstetrics and Gynecology

## 2012-06-15 DIAGNOSIS — N815 Vaginal enterocele: Secondary | ICD-10-CM | POA: Insufficient documentation

## 2012-06-15 DIAGNOSIS — Z90710 Acquired absence of both cervix and uterus: Secondary | ICD-10-CM | POA: Diagnosis present

## 2012-06-15 DIAGNOSIS — N80209 Endometriosis of unspecified fallopian tube, unspecified depth: Secondary | ICD-10-CM | POA: Insufficient documentation

## 2012-06-15 DIAGNOSIS — D251 Intramural leiomyoma of uterus: Secondary | ICD-10-CM | POA: Insufficient documentation

## 2012-06-15 DIAGNOSIS — N83209 Unspecified ovarian cyst, unspecified side: Secondary | ICD-10-CM | POA: Insufficient documentation

## 2012-06-15 DIAGNOSIS — N920 Excessive and frequent menstruation with regular cycle: Secondary | ICD-10-CM | POA: Insufficient documentation

## 2012-06-15 DIAGNOSIS — N8300 Follicular cyst of ovary, unspecified side: Secondary | ICD-10-CM | POA: Insufficient documentation

## 2012-06-15 DIAGNOSIS — N802 Endometriosis of fallopian tube: Secondary | ICD-10-CM | POA: Insufficient documentation

## 2012-06-15 HISTORY — PX: BILATERAL SALPINGECTOMY: SHX5743

## 2012-06-15 HISTORY — PX: ROBOTIC ASSISTED TOTAL HYSTERECTOMY: SHX6085

## 2012-06-15 LAB — HCG, SERUM, QUALITATIVE: Preg, Serum: NEGATIVE

## 2012-06-15 SURGERY — ROBOTIC ASSISTED TOTAL HYSTERECTOMY
Anesthesia: General | Site: Abdomen | Wound class: Clean Contaminated

## 2012-06-15 MED ORDER — KETOROLAC TROMETHAMINE 30 MG/ML IJ SOLN
15.0000 mg | Freq: Once | INTRAMUSCULAR | Status: AC | PRN
  Administered 2012-06-15: 30 mg via INTRAVENOUS

## 2012-06-15 MED ORDER — ARTIFICIAL TEARS OP OINT
TOPICAL_OINTMENT | OPHTHALMIC | Status: DC | PRN
  Administered 2012-06-15: 1 via OPHTHALMIC

## 2012-06-15 MED ORDER — DEXAMETHASONE SODIUM PHOSPHATE 10 MG/ML IJ SOLN
INTRAMUSCULAR | Status: AC
  Filled 2012-06-15: qty 1

## 2012-06-15 MED ORDER — LACTATED RINGERS IV SOLN
INTRAVENOUS | Status: DC
  Administered 2012-06-15 (×5): via INTRAVENOUS

## 2012-06-15 MED ORDER — MONTELUKAST SODIUM 10 MG PO TABS
10.0000 mg | ORAL_TABLET | Freq: Every day | ORAL | Status: DC
  Administered 2012-06-16: 10 mg via ORAL
  Filled 2012-06-15: qty 1

## 2012-06-15 MED ORDER — ACETAMINOPHEN 10 MG/ML IV SOLN
INTRAVENOUS | Status: AC
  Filled 2012-06-15: qty 100

## 2012-06-15 MED ORDER — MIDAZOLAM HCL 5 MG/5ML IJ SOLN
INTRAMUSCULAR | Status: DC | PRN
  Administered 2012-06-15: 2 mg via INTRAVENOUS

## 2012-06-15 MED ORDER — PROPOFOL 10 MG/ML IV EMUL
INTRAVENOUS | Status: DC | PRN
  Administered 2012-06-15: 170 mg via INTRAVENOUS

## 2012-06-15 MED ORDER — KETOROLAC TROMETHAMINE 30 MG/ML IJ SOLN
INTRAMUSCULAR | Status: AC
  Filled 2012-06-15: qty 1

## 2012-06-15 MED ORDER — ROCURONIUM BROMIDE 100 MG/10ML IV SOLN
INTRAVENOUS | Status: DC | PRN
  Administered 2012-06-15: 35 mg via INTRAVENOUS
  Administered 2012-06-15: 5 mg via INTRAVENOUS
  Administered 2012-06-15 (×2): 10 mg via INTRAVENOUS

## 2012-06-15 MED ORDER — INFLUENZA VIRUS VACC SPLIT PF IM SUSP: 0.5000 mL | INTRAMUSCULAR | Status: DC

## 2012-06-15 MED ORDER — ROCURONIUM BROMIDE 50 MG/5ML IV SOLN
INTRAVENOUS | Status: AC
  Filled 2012-06-15: qty 2

## 2012-06-15 MED ORDER — ONDANSETRON HCL 4 MG/2ML IJ SOLN
INTRAMUSCULAR | Status: DC | PRN
  Administered 2012-06-15: 4 mg via INTRAVENOUS

## 2012-06-15 MED ORDER — ACETAMINOPHEN 10 MG/ML IV SOLN: 1000.0000 mg | Freq: Once | INTRAVENOUS | Status: DC

## 2012-06-15 MED ORDER — CITRIC ACID-SODIUM CITRATE 334-500 MG/5ML PO SOLN: 30.0000 mL | Freq: Once | ORAL | Status: DC | PRN

## 2012-06-15 MED ORDER — DEXAMETHASONE SODIUM PHOSPHATE 10 MG/ML IJ SOLN
INTRAMUSCULAR | Status: DC | PRN
  Administered 2012-06-15: 10 mg via INTRAVENOUS

## 2012-06-15 MED ORDER — INDIGOTINDISULFONATE SODIUM 8 MG/ML IJ SOLN
INTRAMUSCULAR | Status: AC
  Filled 2012-06-15: qty 5

## 2012-06-15 MED ORDER — HYDROMORPHONE HCL PF 1 MG/ML IJ SOLN
INTRAMUSCULAR | Status: AC
  Filled 2012-06-15: qty 1

## 2012-06-15 MED ORDER — HYDROMORPHONE HCL PF 1 MG/ML IJ SOLN
INTRAMUSCULAR | Status: AC
  Administered 2012-06-15: 0.5 mg
  Filled 2012-06-15: qty 1

## 2012-06-15 MED ORDER — MIDAZOLAM HCL 2 MG/2ML IJ SOLN
INTRAMUSCULAR | Status: AC
  Filled 2012-06-15: qty 2

## 2012-06-15 MED ORDER — PROPOFOL 10 MG/ML IV EMUL
INTRAVENOUS | Status: AC
  Filled 2012-06-15: qty 20

## 2012-06-15 MED ORDER — FENTANYL CITRATE 0.05 MG/ML IJ SOLN
INTRAMUSCULAR | Status: AC
  Filled 2012-06-15: qty 2

## 2012-06-15 MED ORDER — OXYCODONE-ACETAMINOPHEN 5-325 MG PO TABS: 1.0000 | ORAL_TABLET | ORAL | Status: DC | PRN

## 2012-06-15 MED ORDER — ZOLPIDEM TARTRATE 5 MG PO TABS: 5.0000 mg | ORAL_TABLET | Freq: Every evening | ORAL | Status: DC | PRN

## 2012-06-15 MED ORDER — SCOPOLAMINE 1 MG/3DAYS TD PT72: 1.0000 | MEDICATED_PATCH | Freq: Once | TRANSDERMAL | Status: DC | PRN

## 2012-06-15 MED ORDER — FAMOTIDINE 20 MG PO TABS: 20.0000 mg | ORAL_TABLET | Freq: Once | ORAL | Status: DC | PRN

## 2012-06-15 MED ORDER — FENTANYL CITRATE 0.05 MG/ML IJ SOLN
INTRAMUSCULAR | Status: AC
  Filled 2012-06-15: qty 5

## 2012-06-15 MED ORDER — DIPHENHYDRAMINE HCL 50 MG/ML IJ SOLN: 12.5000 mg | Freq: Four times a day (QID) | INTRAMUSCULAR | Status: DC | PRN

## 2012-06-15 MED ORDER — ONDANSETRON HCL 4 MG/2ML IJ SOLN
4.0000 mg | Freq: Four times a day (QID) | INTRAMUSCULAR | Status: DC | PRN
  Administered 2012-06-15: 4 mg via INTRAVENOUS
  Filled 2012-06-15: qty 2

## 2012-06-15 MED ORDER — LIDOCAINE HCL (CARDIAC) 20 MG/ML IV SOLN
INTRAVENOUS | Status: DC | PRN
  Administered 2012-06-15: 80 mg via INTRAVENOUS

## 2012-06-15 MED ORDER — NALOXONE HCL 0.4 MG/ML IJ SOLN: 0.4000 mg | INTRAMUSCULAR | Status: DC | PRN

## 2012-06-15 MED ORDER — NEOSTIGMINE METHYLSULFATE 1 MG/ML IJ SOLN
INTRAMUSCULAR | Status: DC | PRN
  Administered 2012-06-15: 4 mg via INTRAVENOUS

## 2012-06-15 MED ORDER — PANTOPRAZOLE SODIUM 40 MG PO TBEC: 40.0000 mg | DELAYED_RELEASE_TABLET | Freq: Once | ORAL | Status: DC | PRN

## 2012-06-15 MED ORDER — HYDROMORPHONE 0.3 MG/ML IV SOLN
INTRAVENOUS | Status: DC
  Administered 2012-06-15: 18:00:00 via INTRAVENOUS
  Administered 2012-06-16: 3.33 mg via INTRAVENOUS
  Administered 2012-06-16: 2 mg via INTRAVENOUS
  Administered 2012-06-16: 0.6 mg via INTRAVENOUS
  Administered 2012-06-16: 3.19 mg via INTRAVENOUS
  Filled 2012-06-15: qty 25

## 2012-06-15 MED ORDER — FENTANYL CITRATE 0.05 MG/ML IJ SOLN: 25.0000 ug | INTRAMUSCULAR | Status: DC | PRN

## 2012-06-15 MED ORDER — FENTANYL CITRATE 0.05 MG/ML IJ SOLN
INTRAMUSCULAR | Status: AC
  Administered 2012-06-15: 50 ug via INTRAVENOUS
  Filled 2012-06-15: qty 2

## 2012-06-15 MED ORDER — SODIUM CHLORIDE 0.9 % IJ SOLN: 9.0000 mL | INTRAMUSCULAR | Status: DC | PRN

## 2012-06-15 MED ORDER — SODIUM CHLORIDE 0.9 % IJ SOLN
INTRAMUSCULAR | Status: DC | PRN
  Administered 2012-06-15: 40 mL via INTRAVENOUS

## 2012-06-15 MED ORDER — ROPIVACAINE HCL 5 MG/ML IJ SOLN
INTRAMUSCULAR | Status: DC | PRN
  Administered 2012-06-15: 40 mL

## 2012-06-15 MED ORDER — DIPHENHYDRAMINE HCL 12.5 MG/5ML PO ELIX: 12.5000 mg | ORAL_SOLUTION | Freq: Four times a day (QID) | ORAL | Status: DC | PRN

## 2012-06-15 MED ORDER — GLYCOPYRROLATE 0.2 MG/ML IJ SOLN
INTRAMUSCULAR | Status: AC
  Filled 2012-06-15: qty 3

## 2012-06-15 MED ORDER — GLYCOPYRROLATE 0.2 MG/ML IJ SOLN
INTRAMUSCULAR | Status: DC | PRN
  Administered 2012-06-15: 0.6 mg via INTRAVENOUS
  Administered 2012-06-15: 0.1 mg via INTRAVENOUS

## 2012-06-15 MED ORDER — ACETAMINOPHEN 10 MG/ML IV SOLN
INTRAVENOUS | Status: DC | PRN
  Administered 2012-06-15: 1000 mg via INTRAVENOUS

## 2012-06-15 MED ORDER — FENTANYL CITRATE 0.05 MG/ML IJ SOLN
INTRAMUSCULAR | Status: DC | PRN
  Administered 2012-06-15: 50 ug via INTRAVENOUS
  Administered 2012-06-15: 100 ug via INTRAVENOUS
  Administered 2012-06-15: 50 ug via INTRAVENOUS
  Administered 2012-06-15: 100 ug via INTRAVENOUS

## 2012-06-15 MED ORDER — PNEUMOCOCCAL VAC POLYVALENT 25 MCG/0.5ML IJ INJ
0.5000 mL | INJECTION | INTRAMUSCULAR | Status: DC
  Filled 2012-06-15: qty 0.5

## 2012-06-15 MED ORDER — METOCLOPRAMIDE HCL 10 MG PO TABS: 10.0000 mg | ORAL_TABLET | Freq: Once | ORAL | Status: DC | PRN

## 2012-06-15 MED ORDER — TRAMADOL HCL 50 MG PO TABS: 50.0000 mg | ORAL_TABLET | Freq: Four times a day (QID) | ORAL | Status: DC | PRN

## 2012-06-15 MED ORDER — HYDROMORPHONE HCL PF 1 MG/ML IJ SOLN
INTRAMUSCULAR | Status: DC | PRN
  Administered 2012-06-15: 1 mg via INTRAVENOUS

## 2012-06-15 MED ORDER — ONDANSETRON HCL 4 MG/2ML IJ SOLN
INTRAMUSCULAR | Status: AC
  Filled 2012-06-15: qty 2

## 2012-06-15 MED ORDER — NEOSTIGMINE METHYLSULFATE 1 MG/ML IJ SOLN
INTRAMUSCULAR | Status: AC
  Filled 2012-06-15: qty 1

## 2012-06-15 MED ORDER — LIDOCAINE HCL (CARDIAC) 20 MG/ML IV SOLN
INTRAVENOUS | Status: AC
  Filled 2012-06-15: qty 5

## 2012-06-15 MED ORDER — ROPIVACAINE HCL 5 MG/ML IJ SOLN
INTRAMUSCULAR | Status: AC
  Filled 2012-06-15: qty 60

## 2012-06-15 MED ORDER — DEXTROSE IN LACTATED RINGERS 5 % IV SOLN
INTRAVENOUS | Status: DC
  Administered 2012-06-15 – 2012-06-16 (×2): via INTRAVENOUS

## 2012-06-15 SURGICAL SUPPLY — 65 items
ADH SKN CLS APL DERMABOND .7 (GAUZE/BANDAGES/DRESSINGS) ×2
BAG URINE DRAINAGE (UROLOGICAL SUPPLIES) ×3 IMPLANT
BARRIER ADHS 3X4 INTERCEED (GAUZE/BANDAGES/DRESSINGS) ×3 IMPLANT
BRR ADH 4X3 ABS CNTRL BYND (GAUZE/BANDAGES/DRESSINGS) ×2
CATH FOLEY 3WAY  5CC 16FR (CATHETERS) ×1
CATH FOLEY 3WAY 5CC 16FR (CATHETERS) ×2 IMPLANT
CHLORAPREP W/TINT 26ML (MISCELLANEOUS) ×3 IMPLANT
CLOTH BEACON ORANGE TIMEOUT ST (SAFETY) ×3 IMPLANT
CONT PATH 16OZ SNAP LID 3702 (MISCELLANEOUS) ×3 IMPLANT
COVER MAYO STAND STRL (DRAPES) ×3 IMPLANT
COVER TABLE BACK 60X90 (DRAPES) ×6 IMPLANT
COVER TIP SHEARS 8 DVNC (MISCELLANEOUS) ×2 IMPLANT
COVER TIP SHEARS 8MM DA VINCI (MISCELLANEOUS) ×1
DECANTER SPIKE VIAL GLASS SM (MISCELLANEOUS) ×3 IMPLANT
DERMABOND ADVANCED (GAUZE/BANDAGES/DRESSINGS) ×1
DERMABOND ADVANCED .7 DNX12 (GAUZE/BANDAGES/DRESSINGS) ×2 IMPLANT
DRAPE HUG U DISPOSABLE (DRAPE) ×3 IMPLANT
DRAPE LG THREE QUARTER DISP (DRAPES) ×6 IMPLANT
DRAPE WARM FLUID 44X44 (DRAPE) ×3 IMPLANT
ELECT REM PT RETURN 9FT ADLT (ELECTROSURGICAL) ×3
ELECTRODE REM PT RTRN 9FT ADLT (ELECTROSURGICAL) ×2 IMPLANT
EVACUATOR SMOKE 8.L (FILTER) ×3 IMPLANT
GAUZE VASELINE 3X9 (GAUZE/BANDAGES/DRESSINGS) IMPLANT
GLOVE BIO SURGEON STRL SZ7.5 (GLOVE) ×6 IMPLANT
GOWN STRL REIN XL XLG (GOWN DISPOSABLE) ×18 IMPLANT
GYRUS RUMI II 2.5CM BLUE (DISPOSABLE)
GYRUS RUMI II 3.5CM BLUE (DISPOSABLE) ×3
GYRUS RUMI II 4.0CM BLUE (DISPOSABLE)
KIT ACCESSORY DA VINCI DISP (KITS) ×1
KIT ACCESSORY DVNC DISP (KITS) ×2 IMPLANT
LEGGING LITHOTOMY PAIR STRL (DRAPES) ×3 IMPLANT
NEEDLE INSUFFLATION 120MM (ENDOMECHANICALS) ×3 IMPLANT
PACK LAVH (CUSTOM PROCEDURE TRAY) ×3 IMPLANT
PAD PREP 24X48 CUFFED NSTRL (MISCELLANEOUS) ×6 IMPLANT
PLUG CATH AND CAP STER (CATHETERS) ×3 IMPLANT
PROTECTOR NERVE ULNAR (MISCELLANEOUS) ×6 IMPLANT
RUMI II 3.0CM BLUE KOH-EFFICIE (DISPOSABLE) ×1 IMPLANT
RUMI II GYRUS 2.5CM BLUE (DISPOSABLE) IMPLANT
RUMI II GYRUS 3.5CM BLUE (DISPOSABLE) IMPLANT
RUMI II GYRUS 4.0CM BLUE (DISPOSABLE) IMPLANT
SCISSORS LAP 5X35 DISP (ENDOMECHANICALS) ×1 IMPLANT
SET CYSTO W/LG BORE CLAMP LF (SET/KITS/TRAYS/PACK) IMPLANT
SET IRRIG TUBING LAPAROSCOPIC (IRRIGATION / IRRIGATOR) ×3 IMPLANT
SOLUTION ELECTROLUBE (MISCELLANEOUS) ×3 IMPLANT
SUT VIC AB 0 CT1 27 (SUTURE) ×6
SUT VIC AB 0 CT1 27XBRD ANBCTR (SUTURE) ×4 IMPLANT
SUT VIC AB 0 CT1 27XBRD ANTBC (SUTURE) IMPLANT
SUT VICRYL 0 UR6 27IN ABS (SUTURE) ×3 IMPLANT
SUT VICRYL RAPIDE 4/0 PS 2 (SUTURE) ×6 IMPLANT
SUT VLOC 180 0 9IN  GS21 (SUTURE) ×1
SUT VLOC 180 0 9IN GS21 (SUTURE) IMPLANT
SYR 50ML LL SCALE MARK (SYRINGE) ×3 IMPLANT
SYRINGE 10CC LL (SYRINGE) ×3 IMPLANT
SYSTEM CONVERTIBLE TROCAR (TROCAR) IMPLANT
TIP UTERINE 6.7X10CM GRN DISP (MISCELLANEOUS) ×1 IMPLANT
TOWEL OR 17X24 6PK STRL BLUE (TOWEL DISPOSABLE) ×9 IMPLANT
TROCAR BLADELESS OPT 12M 100M (ENDOMECHANICALS) IMPLANT
TROCAR DILATING TIP 12MM 150MM (ENDOMECHANICALS) ×3 IMPLANT
TROCAR DISP BLADELESS 8 DVNC (TROCAR) ×2 IMPLANT
TROCAR DISP BLADELESS 8MM (TROCAR) ×1
TROCAR XCEL 12X100 BLDLESS (ENDOMECHANICALS) IMPLANT
TROCAR XCEL NON-BLD 5MMX100MML (ENDOMECHANICALS) ×3 IMPLANT
TUBING FILTER THERMOFLATOR (ELECTROSURGICAL) ×3 IMPLANT
WARMER LAPAROSCOPE (MISCELLANEOUS) ×3 IMPLANT
WATER STERILE IRR 1000ML POUR (IV SOLUTION) ×9 IMPLANT

## 2012-06-15 NOTE — Anesthesia Procedure Notes (Signed)
Procedure Name: Intubation Date/Time: 06/15/2012 2:01 PM Performed by: Graciela Husbands Pre-anesthesia Checklist: Patient identified, Patient being monitored, Emergency Drugs available, Timeout performed and Suction available Patient Re-evaluated:Patient Re-evaluated prior to inductionOxygen Delivery Method: Circle system utilized Preoxygenation: Pre-oxygenation with 100% oxygen Intubation Type: IV induction Ventilation: Mask ventilation without difficulty Laryngoscope Size: Mac and 3 Grade View: Grade I Tube type: Oral Laser Tube: Cuffed inflated with minimal occlusive pressure - saline Tube size: 7.0 mm Number of attempts: 1 Airway Equipment and Method: Stylet Placement Confirmation: ETT inserted through vocal cords under direct vision,  breath sounds checked- equal and bilateral and positive ETCO2 Secured at: 20 cm Tube secured with: Tape Dental Injury: Teeth and Oropharynx as per pre-operative assessment

## 2012-06-15 NOTE — Op Note (Signed)
06/15/2012  4:30 PM  PATIENT:  Kristy Walter  42 y.o. female  PRE-OPERATIVE DIAGNOSIS:  Menorrhagia  Fibroids Left ovarian cyst  POST-OPERATIVE DIAGNOSIS:  Menorrhagia  Enterocele  PROCEDURE:  Procedure(s): ROBOTIC ASSISTED TOTAL HYSTERECTOMY LEFT OVARIAN CYSTECTOMY MCCALL CUL DE PLASTY BILATERAL SALPINGECTOMY LYSIS OF ADHESIONS  SURGEON:  Surgeon(s): Lenoard Aden, MD  ASSISTANTSFredric Mare, CNM   ANESTHESIA:   local and general  ESTIMATED BLOOD LOSS: * No blood loss amount entered *   DRAINS: Urinary Catheter (Foley)   LOCAL MEDICATIONS USED:  MARCAINE    and Amount: 10 ml  SPECIMEN:  Source of Specimen:  UTERUS, CERVIX, TUBES. RIGHT OVARIAN CYST WALL  DISPOSITION OF SPECIMEN:  PATHOLOGY  COUNTS:  YES  DICTATION #: [id number]  PLAN OF CARE: 23 HOUR OBSERVATION  PATIENT DISPOSITION:  PACU - hemodynamically stable.

## 2012-06-15 NOTE — Anesthesia Preprocedure Evaluation (Signed)
Anesthesia Evaluation  Patient identified by MRN, date of birth, ID band Patient awake    Reviewed: Allergy & Precautions, H&P , NPO status , Patient's Chart, lab work & pertinent test results, reviewed documented beta blocker date and time   History of Anesthesia Complications Negative for: history of anesthetic complications  Airway Mallampati: I TM Distance: >3 FB Neck ROM: full    Dental  (+) Teeth Intact   Pulmonary asthma (relates to allergies, sinus drainage - has never used her inhaler) ,  breath sounds clear to auscultation  Pulmonary exam normal       Cardiovascular Exercise Tolerance: Good negative cardio ROS  Rhythm:regular Rate:Normal     Neuro/Psych Left forearm nerve injury during lab stick for bloodwork at PAT appt last Thursday.  Has had pain and change in mobility in left arm since the lab draw.  Has not been seen by a neurologist.  Has been extensively counseled that this injury may be exacerbated by arm tucking for surgery today and given the option to cancel surgery.  Patient understands this potential and elects to proceed.  negative psych ROS   GI/Hepatic Neg liver ROS, GERD-  Medicated,  Endo/Other  negative endocrine ROS  Renal/GU negative Renal ROS  Female GU complaint     Musculoskeletal   Abdominal   Peds  Hematology negative hematology ROS (+)   Anesthesia Other Findings   Reproductive/Obstetrics negative OB ROS                           Anesthesia Physical Anesthesia Plan  ASA: II  Anesthesia Plan: General ETT   Post-op Pain Management:    Induction:   Airway Management Planned:   Additional Equipment:   Intra-op Plan:   Post-operative Plan:   Informed Consent: I have reviewed the patients History and Physical, chart, labs and discussed the procedure including the risks, benefits and alternatives for the proposed anesthesia with the patient or  authorized representative who has indicated his/her understanding and acceptance.   Dental Advisory Given  Plan Discussed with: CRNA and Surgeon  Anesthesia Plan Comments:         Anesthesia Quick Evaluation

## 2012-06-15 NOTE — Progress Notes (Signed)
Patient c/o pain in left arm after blood draw at hospital a week ago.  No bruising noted.  IV started in left forearm without incidence or reports of discomfort from patient.  Offered to start IV in right arm but she insisted is was fine to start in left.

## 2012-06-15 NOTE — Progress Notes (Signed)
Patient ID: Kristy Walter, female   DOB: 04-20-1970, 42 y.o.   MRN: [id number] Patient seen and examined. Consent witnessed and signed. No changes noted. Update completed.

## 2012-06-15 NOTE — Transfer of Care (Signed)
Immediate Anesthesia Transfer of Care Note  Patient: Kristy Walter  Procedure(s) Performed: Procedure(s) with comments: ROBOTIC ASSISTED TOTAL HYSTERECTOMY (N/A) - ROBOTIC ASSISTED TOTAL HYSTERECTOMY BILATERAL SALPINGECTOMY (Bilateral) - BILATERAL SALPINGECTOMY  Patient Location: PACU  Anesthesia Type:General  Level of Consciousness: awake, alert , oriented and patient cooperative  Airway & Oxygen Therapy: Patient Spontanous Breathing and Patient connected to nasal cannula oxygen  Post-op Assessment: Report given to PACU RN, Post -op Vital signs reviewed and stable and Patient moving all extremities X 4  Post vital signs: Reviewed and stable  Complications: No apparent anesthesia complications

## 2012-06-15 NOTE — Progress Notes (Signed)
Patient ID: Kristy Walter, female   DOB: September 25, 1970, 42 y.o.   MRN: [id number] Patient presents complaining of numbness , burning and ? Motor deficits in her hand after having her blood drawn last week. Was told by lab technician to have the problem evaluated. Pt did not discuss this with MD until presenting today. States that the problem has not improved.   Dr. Rodman Pickle and I have discussed arm positioning during her surgery and the fact that a possible undiagnosed neurologic injury could be exacerbated due to compression from this positioning. Patient Discussed situation with family and one of her friends Banker from [place]) and desires to proceed with surgery as scheduled. She declines Neurology consultation at this time. She will be scheduled for Neurology consultation post operatively. She declines to postpone surgery until this evaluation is performed. She accepts the risks of surgery and additional risks as detailed above, consent signed. Will proceed with surgery as scheduled.

## 2012-06-15 NOTE — Anesthesia Postprocedure Evaluation (Signed)
  Anesthesia Post-op Note  Patient: Kristy Walter  Procedure(s) Performed: Procedure(s) with comments: ROBOTIC ASSISTED TOTAL HYSTERECTOMY (N/A) - ROBOTIC ASSISTED TOTAL HYSTERECTOMY BILATERAL SALPINGECTOMY (Bilateral) - BILATERAL SALPINGECTOMY  Patient is awake and responsive. Pain and nausea are reasonably well controlled. Vital signs are stable and clinically acceptable. Oxygen saturation is clinically acceptable. There are no apparent anesthetic complications at this time. Her arm pain/numbness is unchanged to her perception. Patient is ready for discharge.

## 2012-06-16 ENCOUNTER — Encounter (HOSPITAL_COMMUNITY): Payer: Self-pay | Admitting: Obstetrics and Gynecology

## 2012-06-16 LAB — CBC
HCT: 33.7 % — ABNORMAL LOW (ref 36.0–46.0)
Hemoglobin: 11 g/dL — ABNORMAL LOW (ref 12.0–15.0)
MCH: 29.3 pg (ref 26.0–34.0)
MCHC: 32.6 g/dL (ref 30.0–36.0)
MCV: 89.6 fL (ref 78.0–100.0)
Platelets: 267 10*3/uL (ref 150–400)
RBC: 3.76 MIL/uL — ABNORMAL LOW (ref 3.87–5.11)
RDW: 12.5 % (ref 11.5–15.5)
WBC: 14.8 10*3/uL — ABNORMAL HIGH (ref 4.0–10.5)

## 2012-06-16 MED ORDER — OXYCODONE-ACETAMINOPHEN 5-325 MG PO TABS: 1.0000 | ORAL_TABLET | ORAL | Status: DC | PRN

## 2012-06-16 MED ORDER — TRAMADOL HCL 50 MG PO TABS: 50.0000 mg | ORAL_TABLET | Freq: Four times a day (QID) | ORAL | Status: DC | PRN

## 2012-06-16 NOTE — Op Note (Signed)
NAMENAKESHA, EBRAHIM NO.:  [id number]  MEDICAL RECORD NO.:  [id number]  LOCATION:  9320                          FACILITY:  [facility]  PHYSICIAN:  Lenoard Aden, M.D.DATE OF BIRTH:  [birth year]  DATE OF PROCEDURE: DATE OF DISCHARGE:                              OPERATIVE REPORT   DESCRIPTION OF PROCEDURE:  After being apprised of risks of anesthesia, infection, bleeding, injury to abdominal organs, possible need for repair, the patient was brought to the operating room.  Consent was signed.  She was placed under general anesthetic, prepped and draped in the usual sterile fashion.  Feet were placed in Yellofin stirrups.  Exam under anesthesia revealed a bulky retroflexed uterus and no adnexal masses.  Foley catheter and roomy retractor placed per vagina in standard fashion.  An infraumbilical incision made with a scalpel. Veress needle was placed.  Opening pressure was -2; 4 L of CO2 insufflated without difficulty.  Trocar was placed atraumatically. Visualization reveals a small area of bleeding along the anterior omentum, which was hemostatic upon visualization.  There was an additional level of adhesions along the right adnexa.  The right side robotic port is placed without difficulty, an 8 mm trocar.  The left 8 mm trocar and a 5-mm assistant port on the left were placed atraumatically under direct visualization.  The robot was docked after achieving deep Trendelenburg position.  The PK forceps and Endoshears were placed.  Adhesions were lysed along the left adnexa.  The left ureter was identified.  The left tube was cauterized along the mesosalpinx and divided.  The retroperitoneal space was entered.  The ureter was identified.  The tubo-ovarian ligament was cauterized and divided.  The round ligament was divided.  The bladder flap was developed sharply, and the left uterine vessels were skeletonized.  They were cauterized in a 3-point fashion, but not divided.   Attention was turned to the right adnexa where additional adhesions were lysed.  The right tube was cauterized along the mesosalpinx and divided.  The retroperitoneal space was entered.  The ureter was identified, and the tubo-ovarian ligament was cauterized and divided.  At this time, the round ligament was divided.  The bladder flap was further developed sharply, and the roomy ring was identified circumferentially 360 degrees.  Skeletonization of the right uterine artery was performed without difficulty.  It was cauterized and divided.  The left uterine artery was cauterized and divided.  Good hemostasis was noted. Circumferential division at the cervicovaginal junction was performed using the Endoshears detaching the specimen without difficulty, and this was retracted in the vagina without difficulty.  At this time, a 0 V-Loc suture was used to close the cuff in a continuous running fashion.  A second imbricating layer was placed.  A McCall culdoplasty suture was placed without difficulty after identifying the enterocele and identifying the ureters bilaterally.  Suture was cut, and good hemostasis was noted.  The ureter was identified to be peristalsing normally bilaterally after uterosacral plication.  Left ovarian cyst was opened, divided, cauterized, and the cyst wall was sent for analysis. The right ovary appears normal.  At this time, the robot was undocked, and the CO2 was released.  Visualization after the patient was taken out Trendelenburg reveals small amount of bleeding along the anterior omental area, and this area was grasped with a Kleppinger bipolar cautery and cauterized.  Good hemostasis was assured.  Irrigation was accomplished.  All instruments were then removed under direct visualization.  CO2 was released.  Incisions were closed using 0 Vicryl, 4-0 Vicryl, and Dermabond.  The patient tolerated the procedure well, was awakened, and transferred to recovery in good  condition.     Lenoard Aden, M.D.     RJT/MEDQ  D:  06/15/2012  T:  06/16/2012  Job:  [id number]

## 2012-06-16 NOTE — Progress Notes (Signed)
1 Day Post-Op Procedure(s) (LRB): ROBOTIC ASSISTED TOTAL HYSTERECTOMY (N/A) BILATERAL SALPINGECTOMY (Bilateral)  Subjective: Patient reports nausea, incisional pain, tolerating PO, + flatus and no problems voiding.    Objective: BP 100/61  Pulse 60  Temp(Src) 97.5 F (36.4 C) (Oral)  Resp 19  Ht 5' 7.5" (1.715 m)  Wt 77.111 kg (170 lb)  BMI 26.22 kg/m2  SpO2 96%  I have reviewed patient's vital signs, intake and output and medications. CBC    Component Value Date/Time   WBC 14.8* 06/16/2012 0550   RBC 3.76* 06/16/2012 0550   HGB 11.0* 06/16/2012 0550   HCT 33.7* 06/16/2012 0550   PLT 267 06/16/2012 0550   MCV 89.6 06/16/2012 0550   MCH 29.3 06/16/2012 0550   MCHC 32.6 06/16/2012 0550   RDW 12.5 06/16/2012 0550   LYMPHSABS 1.6 10/10/2007 1015   MONOABS 0.5 10/10/2007 1015   EOSABS 0.1 10/10/2007 1015   BASOSABS 0.0 10/10/2007 1015      General: alert, cooperative and appears stated age Resp: clear to auscultation bilaterally and normal percussion bilaterally Cardio: regular rate and rhythm, S1, S2 normal, no murmur, click, rub or gallop and normal apical impulse GI: soft, non-tender; bowel sounds normal; no masses,  no organomegaly and incision: clean, dry and intact Extremities: extremities normal, atraumatic, no cyanosis or edema and Homans sign is negative, no sign of DVT Vaginal Bleeding: minimal  Assessment: s/p Procedure(s) with comments: ROBOTIC ASSISTED TOTAL HYSTERECTOMY (N/A) - ROBOTIC ASSISTED TOTAL HYSTERECTOMY BILATERAL SALPINGECTOMY (Bilateral) - BILATERAL SALPINGECTOMY: stable, progressing well and tolerating diet  Plan: Advance diet Encourage ambulation Advance to PO medication Discontinue IV fluids Discharge home  LOS: 1 day    Aaisha Sliter J 06/16/2012, 7:02 AM

## 2012-06-16 NOTE — Progress Notes (Signed)
t is discharged in the care of husband. Downstairs per ambulatory. Denies pain or discomfort. Lapsites  Are clean and dry. Scanty to small amt of vaginal bleeding noted on V-pad. Discharge instructions were given to pt. Understood all instructions well. Questions were asked and answered.Prior to discharge pt used the bathroom and voided approx 200cc.Stated I  Have a bladder infection. Call the Doctor. Dr Billy Coast notified of or pt request Orders were received and carried. Pt  To be discharged  And Rx were to be called to her drug store.  For bladder infection, also  Pt was also informed that some of the problems was the fact that the pt had a foley during procedure. Pt. Stated that she understood.

## 2012-06-16 NOTE — Anesthesia Postprocedure Evaluation (Signed)
  Anesthesia Post-op Note  Patient: Kristy Walter  Procedure(s) Performed: Procedure(s) with comments: ROBOTIC ASSISTED TOTAL HYSTERECTOMY (N/A) - ROBOTIC ASSISTED TOTAL HYSTERECTOMY BILATERAL SALPINGECTOMY (Bilateral) - BILATERAL SALPINGECTOMY  Patient Location: PACU and [facility] Unit  Anesthesia Type:General  Level of Consciousness: awake, alert , oriented and patient cooperative  Airway and Oxygen Therapy: Patient Spontanous Breathing  Post-op Pain: none  Post-op Assessment: Post-op Vital signs reviewed and Patient's Cardiovascular Status Stable  Post-op Vital Signs: Reviewed and stable  Complications: No apparent anesthesia complications

## 2012-09-15 ENCOUNTER — Telehealth: Payer: Self-pay | Admitting: *Deleted

## 2012-09-15 DIAGNOSIS — IMO0002 Reserved for concepts with insufficient information to code with codable children: Secondary | ICD-10-CM

## 2012-09-15 NOTE — Telephone Encounter (Signed)
Message copied by Monico Blitz on Fri Sep 15, 2012  3:59 PM ------      Message from: [facility], [place]      Created: Fri Sep 15, 2012  2:42 PM      Contact: PT       Pt states she has a "hot spot" on lower left side.  She states Dr. Anne Hahn saw her for a "hot spot" on her neck.  She is wanting to be seen ASAP.  Please call and advise @ [phone]. ------

## 2012-09-15 NOTE — Telephone Encounter (Signed)
I called patient. Her left occipital neuralgia is worsening. I'll get an injection set up. The patient has not been seen for about a year and a half. The patient also reports an area in the low back, on the hip where the area is tender, and " hollowed out"I'm not sure what this represents. The patient may need a revisit.

## 2012-09-15 NOTE — Telephone Encounter (Signed)
Message copied by Monico Blitz on Fri Sep 15, 2012  3:54 PM ------      Message from: [facility], [place]      Created: Fri Sep 15, 2012  2:42 PM      Contact: PT       Pt states she has a "hot spot" on lower left side.  She states Dr. Anne Hahn saw her for a "hot spot" on her neck.  She is wanting to be seen ASAP.  Please call and advise @ [phone]. ------

## 2012-09-18 ENCOUNTER — Other Ambulatory Visit: Payer: Self-pay | Admitting: Neurology

## 2012-09-18 DIAGNOSIS — M5481 Occipital neuralgia: Secondary | ICD-10-CM

## 2012-09-27 ENCOUNTER — Telehealth: Payer: Self-pay | Admitting: Neurology

## 2013-07-17 ENCOUNTER — Other Ambulatory Visit: Payer: Self-pay

## 2013-07-17 ENCOUNTER — Other Ambulatory Visit: Payer: Self-pay | Admitting: Internal Medicine

## 2013-07-17 DIAGNOSIS — Z1231 Encounter for screening mammogram for malignant neoplasm of breast: Secondary | ICD-10-CM

## 2013-08-24 ENCOUNTER — Other Ambulatory Visit: Payer: Self-pay | Admitting: Internal Medicine

## 2013-08-24 ENCOUNTER — Ambulatory Visit
Admission: RE | Admit: 2013-08-24 | Discharge: 2013-08-24 | Disposition: A | Discharge status: Home / Self Care | Payer: 59 | Source: Ambulatory Visit

## 2013-08-24 ENCOUNTER — Ambulatory Visit: Payer: 59

## 2013-08-24 ENCOUNTER — Encounter (INDEPENDENT_AMBULATORY_CARE_PROVIDER_SITE_OTHER): Payer: Self-pay

## 2013-08-24 DIAGNOSIS — M5481 Occipital neuralgia: Secondary | ICD-10-CM

## 2013-08-24 DIAGNOSIS — Z1231 Encounter for screening mammogram for malignant neoplasm of breast: Secondary | ICD-10-CM

## 2014-05-03 ENCOUNTER — Other Ambulatory Visit: Payer: Self-pay | Admitting: Nurse Practitioner

## 2014-05-03 DIAGNOSIS — E049 Nontoxic goiter, unspecified: Secondary | ICD-10-CM

## 2014-06-03 ENCOUNTER — Other Ambulatory Visit: Payer: Self-pay | Admitting: Family Medicine

## 2014-06-03 DIAGNOSIS — Z20828 Contact with and (suspected) exposure to other viral communicable diseases: Secondary | ICD-10-CM

## 2014-06-03 MED ORDER — OSELTAMIVIR PHOSPHATE 75 MG PO CAPS: 75.0000 mg | ORAL_CAPSULE | Freq: Every day | ORAL | Status: DC

## 2014-06-03 NOTE — Progress Notes (Signed)
Saw her husband today with positive flu test.  They are leaving on vacation out of the country in 4 days. Called pt and discussed with her- she is generally healthy, no change of pregnancy, she would like to use prophylaxis,  Will rx this for her

## 2015-05-13 ENCOUNTER — Other Ambulatory Visit: Payer: Self-pay | Admitting: Internal Medicine

## 2015-05-13 ENCOUNTER — Ambulatory Visit
Admission: RE | Admit: 2015-05-13 | Discharge: 2015-05-13 | Disposition: A | Discharge status: Home / Self Care | Payer: 59 | Source: Ambulatory Visit | Attending: Internal Medicine | Admitting: Internal Medicine

## 2015-05-13 DIAGNOSIS — R059 Cough, unspecified: Secondary | ICD-10-CM

## 2015-05-13 DIAGNOSIS — R509 Fever, unspecified: Secondary | ICD-10-CM

## 2015-05-13 DIAGNOSIS — R05 Cough: Secondary | ICD-10-CM

## 2015-05-27 ENCOUNTER — Ambulatory Visit: Payer: Self-pay | Admitting: Allergy and Immunology

## 2015-06-10 ENCOUNTER — Ambulatory Visit (INDEPENDENT_AMBULATORY_CARE_PROVIDER_SITE_OTHER): Payer: 59 | Admitting: Allergy and Immunology

## 2015-06-10 ENCOUNTER — Encounter: Payer: Self-pay | Admitting: Allergy and Immunology

## 2015-06-10 VITALS — BP 110/72 | HR 72 | Temp 98.3°F | Resp 16 | Ht 66.54 in | Wt 165.6 lb

## 2015-06-10 DIAGNOSIS — J4520 Mild intermittent asthma, uncomplicated: Secondary | ICD-10-CM

## 2015-06-10 DIAGNOSIS — J387 Other diseases of larynx: Secondary | ICD-10-CM | POA: Diagnosis not present

## 2015-06-10 DIAGNOSIS — H1010 Acute atopic conjunctivitis, unspecified eye: Secondary | ICD-10-CM | POA: Diagnosis not present

## 2015-06-10 DIAGNOSIS — J309 Allergic rhinitis, unspecified: Secondary | ICD-10-CM | POA: Diagnosis not present

## 2015-06-10 DIAGNOSIS — K219 Gastro-esophageal reflux disease without esophagitis: Secondary | ICD-10-CM

## 2015-06-10 MED ORDER — ALBUTEROL SULFATE HFA 108 (90 BASE) MCG/ACT IN AERS: INHALATION_SPRAY | RESPIRATORY_TRACT | Status: DC

## 2015-06-10 MED ORDER — MONTELUKAST SODIUM 10 MG PO TABS: ORAL_TABLET | ORAL | Status: DC

## 2015-06-10 NOTE — Patient Instructions (Addendum)
  1. Allergen avoidance measures - dust mite and pollens  2. Treat inflammation:   A. montelukast 10 mg daily  B. OTC Rhinocort one spray each nostril one time per day. Coupon.  3. Treat reflux:   A. Taper off all forms of caffeine slowly  B. continue Dexilant 60 mg twice a day  4. If needed:   A. OTC antihistamine  B. nasal saline wash  C. ProAir HFA 2 puffs every 4-6 hours  D. Bepreve 1 drop each eye twice a day. Coupon.  5. Consider a course of immunotherapy  6. Return to clinic in summer 2017 or earlier if problem

## 2015-06-10 NOTE — Progress Notes (Signed)
NEW PATIENT NOTE  Referring Provider: Glendale Chard, MD Primary Provider: Maximino Greenland, MD Date of office visit: 06/10/2015    Subjective:   Chief Complaint:  Kristy Walter (DOB: 04/04/1970) is a 45 y.o. female with a chief complaint of Allergic Rhinitis  and Shortness of Breath  who presents to the clinic on 06/10/2015 with the following problems:  HPI Comments: Kristy Walter presents this clinic in evaluation of respiratory tract problems. I last saw her in this clinic in February 2013 for allergic rhinoconjunctivitis and a history of intermittent asthma and significant reflux-induced respiratory disease. Presently she's using a combination of a nasal steroid, antihistamine, and Dexilant 60 mg twice a day to control her symptoms. Yet she still continues to have issues with constant postnasal drip, throat clearing, Anita cough in the morning to clear out her throat, nasal congestion, sneezing, rhinorrhea in addition to chest pressure but no obvious wheezing or shortness of breath except if she exercises to a great extent. Her requirement for a short acting bronchodilator is a few times a year. Provoking factors for her symptoms include exposure to dust and pollens. This past Christmas season through February she had a difficult and prolonged episode of respiratory tract infection requiring 3 antibiotics in evaluation by Dr. Janace Hoard who performed rhinoscopy. Generally, she appears to have resolved that issue and now she is just back to her chronic allergic of her airway symptoms.   Past Medical History  Diagnosis Date  . Seasonal allergies   . Asthma     no inhaler  . GERD (gastroesophageal reflux disease)   . Anemia     no iron    Past Surgical History  Procedure Laterality Date  . Cholecystectomy    . Tonsillectomy    . Nasal sinus surgery    . Clavicle surgery    . Breast enhancement surgery    . Robotic assisted total hysterectomy N/A 06/15/2012    Procedure: ROBOTIC  ASSISTED TOTAL HYSTERECTOMY;  Surgeon: Lovenia Kim, MD;  Location: [facility];  Service: Gynecology;  Laterality: N/A;  ROBOTIC ASSISTED TOTAL HYSTERECTOMY  . Bilateral salpingectomy Bilateral 06/15/2012    Procedure: BILATERAL SALPINGECTOMY;  Surgeon: Lovenia Kim, MD;  Location: [facility];  Service: Gynecology;  Laterality: Bilateral;  BILATERAL SALPINGECTOMY   Current outpatient prescriptions:  .  BELVIQ 10 MG TABS, Take 1 tablet by mouth 2 (two) times daily., Disp: , Rfl: 2 .  cetirizine (ZYRTEC) 10 MG tablet, Take 10 mg by mouth daily., Disp: , Rfl:  .  dexlansoprazole (DEXILANT) 60 MG capsule, Take 60 mg by mouth daily., Disp: , Rfl:  .  diphenhydrAMINE (BENADRYL) 25 mg capsule, Take 25 mg by mouth every 6 (six) hours as needed., Disp: , Rfl:   Allergies  Allergen Reactions  . Sulfa Antibiotics Hives and Rash  . Keflex [Cephalexin]     Review of systems negative except as noted in HPI / PMHx or noted below:  Review of Systems  Constitutional: Negative.   HENT: Negative.   Eyes: Negative.   Respiratory: Negative.   Cardiovascular: Negative.   Gastrointestinal: Negative.   Genitourinary: Negative.   Musculoskeletal: Negative.   Skin: Negative.   Neurological: Negative.   Endo/Heme/Allergies: Negative.   Psychiatric/Behavioral: Negative.     Family History  Problem Relation Age of Onset  . Cancer Mother   . Cervical cancer Sister   . Allergic rhinitis Brother   . Allergic rhinitis Son     Social History  Social History  . Marital Status: Married    Spouse Name: N/A  . Number of Children: N/A  . Years of Education: N/A   Occupational History  . Not on file.   Social History Main Topics  . Smoking status: Never Smoker   . Smokeless tobacco: Never Used  . Alcohol Use: Yes     Comment: rarely  . Drug Use: No  . Sexual Activity: Not on file   Other Topics Concern  . Not on file   Social History Narrative    Environmental and Social history  Lives in  a house with a dry environment, a dog located inside the household, hardwood in the bedroom, no plastic on the bed or pillow, and no smoking ongoing with inside the household. 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   Objective:   Filed Vitals:   06/10/15 0817  BP: 110/72  Pulse: 72  Temp: 98.3 F (36.8 C)  Resp: 16   Height: 5' 6.53" (169 cm) Weight: 165 lb 9.1 oz (75.1 kg)  Physical Exam  Constitutional: She is well-developed, well-nourished, and in no distress.  HENT:  Head: Normocephalic.  Right Ear: Tympanic membrane, external ear and ear canal normal.  Left Ear: Tympanic membrane, external ear and ear canal normal.  Nose: Nose normal. No mucosal edema or rhinorrhea.  Mouth/Throat: Uvula is midline, oropharynx is clear and moist and mucous membranes are normal. No oropharyngeal exudate.  Eyes: Conjunctivae are normal.  Neck: Trachea normal. No tracheal tenderness present. No tracheal deviation present. No thyromegaly present.  Cardiovascular: Normal rate, regular rhythm, S1 normal, S2 normal and normal heart sounds.   No murmur heard. Pulmonary/Chest: Breath sounds normal. No stridor. No respiratory distress. She has no wheezes. She has no rales.  Musculoskeletal: She exhibits no edema.  Lymphadenopathy:       Head (right side): No tonsillar adenopathy present.       Head (left side): No tonsillar adenopathy present.    She has no cervical adenopathy.    She has no axillary adenopathy.  Neurological: She is alert. Gait normal.  Skin: No rash noted. She is not diaphoretic. No erythema. Nails show no clubbing.  Psychiatric: Mood and affect normal.     Diagnostics: Allergy skin tests were not performed.   Spirometry was performed and demonstrated an FEV1 of 3.18 @ 108 % of predicted.  The patient had an Asthma Control Test with the following results:  .     Assessment and Plan:    1. Allergic rhinoconjunctivitis   2. LPRD (laryngopharyngeal reflux  disease)   3. Asthma, mild intermittent, well-controlled     1. Allergen avoidance measures - dust mite and pollens  2. Treat inflammation:   A. montelukast 10 mg daily  B. OTC Rhinocort one spray each nostril one time per day. Coupon.  3. Treat reflux:   A. Taper off all forms of caffeine slowly  B. continue Dexilant 60 mg twice a day  4. If needed:   A. OTC antihistamine  B. nasal saline wash  C. ProAir HFA 2 puffs every 4-6 hours  D. Bepreve 1 drop each eye twice a day. Coupon.  5. Consider a course of immunotherapy  6. Return to clinic in summer 2017 or earlier if problem  In an attempt to get Jacqueline's respiratory tract symptoms under better control i made a recommendation to taper off all forms of caffeine to help with her LPR and will add and a  leukotriene modifier and a little bit more attention to allergen avoidance measures treat her atopic component. If this is unsuccessful then she should consider a course immunotherapy. I gave her literature on immunotherapy during today's visit and she's presently considering this option. She would need to have skin testing performed should she desire to undergo a course immunotherapy. We'll see what happens over the course of the next several months.  Allena Katz, MD [place]

## 2015-06-17 ENCOUNTER — Other Ambulatory Visit: Payer: Self-pay

## 2015-08-13 ENCOUNTER — Other Ambulatory Visit: Payer: Self-pay | Admitting: Certified Nurse Midwife

## 2015-08-13 DIAGNOSIS — R928 Other abnormal and inconclusive findings on diagnostic imaging of breast: Secondary | ICD-10-CM

## 2015-08-15 ENCOUNTER — Ambulatory Visit
Admission: RE | Admit: 2015-08-15 | Discharge: 2015-08-15 | Disposition: A | Discharge status: Home / Self Care | Payer: 59 | Source: Ambulatory Visit | Attending: Certified Nurse Midwife | Admitting: Certified Nurse Midwife

## 2015-08-15 DIAGNOSIS — R928 Other abnormal and inconclusive findings on diagnostic imaging of breast: Secondary | ICD-10-CM

## 2015-08-19 ENCOUNTER — Other Ambulatory Visit: Payer: 59

## 2015-08-26 ENCOUNTER — Other Ambulatory Visit: Payer: 59

## 2015-09-18 ENCOUNTER — Other Ambulatory Visit: Payer: Self-pay | Admitting: *Deleted

## 2015-09-18 MED ORDER — MONTELUKAST SODIUM 10 MG PO TABS: ORAL_TABLET | ORAL | Status: DC

## 2015-11-24 ENCOUNTER — Other Ambulatory Visit: Payer: Self-pay | Admitting: Gastroenterology

## 2015-11-24 DIAGNOSIS — R1084 Generalized abdominal pain: Secondary | ICD-10-CM

## 2015-12-18 ENCOUNTER — Other Ambulatory Visit (HOSPITAL_COMMUNITY): Payer: Self-pay | Admitting: Endocrinology

## 2015-12-18 DIAGNOSIS — E0590 Thyrotoxicosis, unspecified without thyrotoxic crisis or storm: Secondary | ICD-10-CM

## 2015-12-24 ENCOUNTER — Encounter (HOSPITAL_COMMUNITY): Payer: 59

## 2015-12-25 ENCOUNTER — Other Ambulatory Visit (HOSPITAL_COMMUNITY): Payer: 59

## 2015-12-29 ENCOUNTER — Encounter (HOSPITAL_COMMUNITY): Payer: Self-pay | Admitting: Radiology

## 2015-12-29 ENCOUNTER — Encounter (HOSPITAL_COMMUNITY)
Admission: RE | Admit: 2015-12-29 | Discharge: 2015-12-29 | Disposition: A | Discharge status: Home / Self Care | Payer: 59 | Source: Ambulatory Visit | Attending: Endocrinology | Admitting: Endocrinology

## 2015-12-29 DIAGNOSIS — E0590 Thyrotoxicosis, unspecified without thyrotoxic crisis or storm: Secondary | ICD-10-CM | POA: Insufficient documentation

## 2015-12-29 MED ORDER — SODIUM IODIDE I 131 CAPSULE: 15.0000 | Freq: Once | INTRAVENOUS | Status: DC | PRN

## 2015-12-30 ENCOUNTER — Encounter (HOSPITAL_COMMUNITY)
Admission: RE | Admit: 2015-12-30 | Discharge: 2015-12-30 | Disposition: A | Discharge status: Home / Self Care | Payer: 59 | Source: Ambulatory Visit | Attending: Endocrinology | Admitting: Endocrinology

## 2015-12-30 DIAGNOSIS — E0590 Thyrotoxicosis, unspecified without thyrotoxic crisis or storm: Secondary | ICD-10-CM | POA: Diagnosis present

## 2015-12-30 MED ORDER — SODIUM PERTECHNETATE TC 99M INJECTION
9.9000 | Freq: Once | INTRAVENOUS | Status: AC | PRN
  Administered 2015-12-30: 10 via INTRAVENOUS

## 2016-01-09 ENCOUNTER — Other Ambulatory Visit (HOSPITAL_COMMUNITY): Payer: Self-pay | Admitting: Endocrinology

## 2016-01-13 ENCOUNTER — Other Ambulatory Visit (HOSPITAL_COMMUNITY): Payer: Self-pay | Admitting: Endocrinology

## 2016-01-13 DIAGNOSIS — E0590 Thyrotoxicosis, unspecified without thyrotoxic crisis or storm: Secondary | ICD-10-CM

## 2016-01-19 ENCOUNTER — Encounter (HOSPITAL_COMMUNITY)
Admission: RE | Admit: 2016-01-19 | Discharge: 2016-01-19 | Disposition: A | Discharge status: Home / Self Care | Payer: 59 | Source: Ambulatory Visit | Attending: Endocrinology | Admitting: Endocrinology

## 2016-01-19 DIAGNOSIS — E0590 Thyrotoxicosis, unspecified without thyrotoxic crisis or storm: Secondary | ICD-10-CM | POA: Diagnosis present

## 2016-01-19 LAB — HCG, SERUM, QUALITATIVE: Preg, Serum: NEGATIVE

## 2016-01-19 MED ORDER — SODIUM IODIDE I 131 CAPSULE
38.7000 | Freq: Once | INTRAVENOUS | Status: AC | PRN
  Administered 2016-01-19: 38.7 via ORAL

## 2016-01-26 ENCOUNTER — Ambulatory Visit (HOSPITAL_COMMUNITY)
Admission: EM | Admit: 2016-01-26 | Discharge: 2016-01-26 | Disposition: A | Discharge status: Home / Self Care | Payer: 59 | Attending: Family Medicine | Admitting: Family Medicine

## 2016-01-26 ENCOUNTER — Encounter (HOSPITAL_COMMUNITY): Payer: Self-pay | Admitting: Family Medicine

## 2016-01-26 DIAGNOSIS — A498 Other bacterial infections of unspecified site: Secondary | ICD-10-CM | POA: Diagnosis not present

## 2016-01-26 DIAGNOSIS — A09 Infectious gastroenteritis and colitis, unspecified: Secondary | ICD-10-CM | POA: Diagnosis not present

## 2016-01-26 MED ORDER — CIPROFLOXACIN HCL 500 MG PO TABS: 500.0000 mg | ORAL_TABLET | Freq: Two times a day (BID) | ORAL | 0 refills | Status: DC

## 2016-01-26 NOTE — ED Triage Notes (Signed)
The patient presented to the [facility] with a complaint of N/V/D that started on 01/15/2016. The patient reported that she was at the [place] and became sick with N/V/D. She stated that the infirmary at the resort gave her a medicine to stop the symptoms that she continued to take. The patient reported that the Vomiting stopped when she arrived back [place] the states but she continues to have diarrhea. She also reported that her husband was evaluated at the [facility] and had a positive stool culture.

## 2016-01-26 NOTE — ED Provider Notes (Addendum)
[facility]    CSN: [id number] Arrival date & time: 01/26/16  1708     History   Chief Complaint Chief Complaint  Patient presents with  . Nausea    HPI Kristy Walter is a 45 y.o. female.   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 She's married and recently came back from [place]  She arrived in [place] on October 13. On the 19th, while still in the [place] patient developed vomiting and separately diarrhea. She was given a medicine there which seemed to control her symptoms but as soon as she arrived back in the right states, symptoms began again. She stopped vomiting but she still having explosive diarrhea.  Her husband has had the same symptoms and when he was evaluated here, a PCR was done on his stool which revealed antral toxigenic Escherichia coli. He was treated Escherichia coli and rapidly improved.  Patient has had work obligations so she's not been able to get in until today.      Past Medical History:  Diagnosis Date  . Anemia    no iron  . Asthma    no inhaler  . GERD (gastroesophageal reflux disease)   . Seasonal allergies     Patient Active Problem List   Diagnosis Date Noted  . Status post complete hysterectomy 06/15/2012    Past Surgical History:  Procedure Laterality Date  . BILATERAL SALPINGECTOMY Bilateral 06/15/2012   Procedure: BILATERAL SALPINGECTOMY;  Surgeon: Lovenia Kim, MD;  Location: [facility];  Service: Gynecology;  Laterality: Bilateral;  BILATERAL SALPINGECTOMY  . BREAST ENHANCEMENT SURGERY    . CHOLECYSTECTOMY    . CLAVICLE SURGERY    . NASAL SINUS SURGERY    . ROBOTIC ASSISTED TOTAL HYSTERECTOMY N/A 06/15/2012   Procedure: ROBOTIC ASSISTED TOTAL HYSTERECTOMY;  Surgeon: Lovenia Kim, MD;  Location: [facility];  Service: Gynecology;  Laterality: N/A;  ROBOTIC ASSISTED TOTAL HYSTERECTOMY  . TONSILLECTOMY      OB History    No data available        Home Medications    Prior to Admission medications   Medication Sig Start Date End Date Taking? Authorizing Provider  BELVIQ 10 MG TABS Take 1 tablet by mouth 2 (two) times daily. 06/03/15  Yes Historical Provider, MD  cetirizine (ZYRTEC) 10 MG tablet Take 10 mg by mouth daily.   Yes Historical Provider, MD  dexlansoprazole (DEXILANT) 60 MG capsule Take 60 mg by mouth daily.   Yes Historical Provider, MD  montelukast (SINGULAIR) 10 MG tablet TAKE ONE TABLET ONCE DAILY AS DIRECTED 09/18/15  Yes Jiles Prows, MD  albuterol (PROAIR HFA) 108 (90 Base) MCG/ACT inhaler INHALE TWO PUFFS EVERY 4-6 HOURS IF NEEDED FOR COUGH OR WHEEZE. 06/10/15   Jiles Prows, MD  ciprofloxacin (CIPRO) 500 MG tablet Take 1 tablet (500 mg total) by mouth 2 (two) times daily. 01/26/16   Robyn Haber, MD  diphenhydrAMINE (BENADRYL) 25 mg capsule Take 25 mg by mouth every 6 (six) hours as needed.    Historical Provider, MD    Family History Family History  Problem Relation Age of Onset  . Cancer Mother   . Cervical cancer Sister   . Allergic rhinitis Brother   . Allergic rhinitis Son     Social History Social History  Substance Use Topics  . Smoking status: Never Smoker  . Smokeless tobacco: Never Used  . Alcohol use Yes     Comment:  rarely     Allergies   Sulfa antibiotics and Keflex [cephalexin]   Review of Systems Review of Systems  Constitutional: Negative for fever.  HENT: Negative.   Respiratory: Negative.   Gastrointestinal: Positive for diarrhea, nausea and vomiting.     Physical Exam Triage Vital Signs ED Triage Vitals [01/26/16 1739]  Enc Vitals Group     BP 134/79     Pulse Rate 70     Resp 18     Temp 98.1 F (36.7 C)     Temp Source Oral     SpO2 98 %     Weight      Height      Head Circumference      Peak Flow      Pain Score      Pain Loc      Pain Edu?      Excl. in [place]?    No data found.   Updated Vital Signs BP 134/79 (BP Location: Left Arm)   Pulse  70   Temp 98.1 F (36.7 C) (Oral)   Resp 18   LMP  (Exact Date)   SpO2 98%    Physical Exam  Constitutional: She is oriented to person, place, and time. She appears well-developed and well-nourished.  HENT:  Head: Normocephalic.  Right Ear: External ear normal.  Left Ear: External ear normal.  Mouth/Throat: Oropharynx is clear and moist.  Eyes: Conjunctivae and EOM are normal. Pupils are equal, round, and reactive to light.  Neck: Normal range of motion. Neck supple.  Cardiovascular: Normal rate.   Pulmonary/Chest: Effort normal.  Abdominal: Soft.  Musculoskeletal: Normal range of motion.  Neurological: She is alert and oriented to person, place, and time.  Skin: Skin is warm and dry.  Nursing note and vitals reviewed.    UC Treatments / Results  Labs (all labs ordered are listed, but only abnormal results are displayed) Labs Reviewed - No data to display  EKG  EKG Interpretation None       Radiology No results found.  Procedures Procedures (including critical care time)  Medications Ordered in UC Medications - No data to display   Initial Impression / Assessment and Plan / UC Course  I have reviewed the triage vital signs and the nursing notes.  Pertinent labs & imaging results that were available during my care of the patient were reviewed by me and considered in my medical decision making (see chart for details).  Clinical Course    Final Clinical Impressions(s) / UC Diagnoses   Final diagnoses:  Escherichia coli (E. coli) infection  Diarrhea of infectious origin    New Prescriptions New Prescriptions   CIPROFLOXACIN (CIPRO) 500 MG TABLET    Take 1 tablet (500 mg total) by mouth 2 (two) times daily.     Robyn Haber, MD 01/26/16 1807    Robyn Haber, MD 01/26/16 [phone]

## 2016-03-03 ENCOUNTER — Encounter (HOSPITAL_COMMUNITY): Payer: Self-pay

## 2016-03-03 ENCOUNTER — Emergency Department (HOSPITAL_COMMUNITY)
Admission: EM | Admit: 2016-03-03 | Discharge: 2016-03-03 | Disposition: A | Discharge status: Home / Self Care | Payer: 59 | Attending: Emergency Medicine | Admitting: Emergency Medicine

## 2016-03-03 ENCOUNTER — Encounter (HOSPITAL_COMMUNITY): Payer: Self-pay | Admitting: Emergency Medicine

## 2016-03-03 ENCOUNTER — Ambulatory Visit (HOSPITAL_COMMUNITY)
Admission: EM | Admit: 2016-03-03 | Discharge: 2016-03-03 | Disposition: A | Discharge status: Nursing Home (Includes ICF) | Payer: 59 | Attending: Emergency Medicine | Admitting: Emergency Medicine

## 2016-03-03 DIAGNOSIS — R109 Unspecified abdominal pain: Secondary | ICD-10-CM | POA: Diagnosis present

## 2016-03-03 DIAGNOSIS — J45909 Unspecified asthma, uncomplicated: Secondary | ICD-10-CM | POA: Insufficient documentation

## 2016-03-03 DIAGNOSIS — R1011 Right upper quadrant pain: Secondary | ICD-10-CM | POA: Insufficient documentation

## 2016-03-03 LAB — COMPREHENSIVE METABOLIC PANEL
ALT: 18 U/L (ref 14–54)
AST: 24 U/L (ref 15–41)
Albumin: 4.4 g/dL (ref 3.5–5.0)
Alkaline Phosphatase: 57 U/L (ref 38–126)
Anion gap: 5 (ref 5–15)
BUN: 5 mg/dL — ABNORMAL LOW (ref 6–20)
CO2: 28 mmol/L (ref 22–32)
Calcium: 9.6 mg/dL (ref 8.9–10.3)
Chloride: 105 mmol/L (ref 101–111)
Creatinine, Ser: 0.74 mg/dL (ref 0.44–1.00)
GFR calc Af Amer: >60 mL/min (ref >60)
GFR calc non Af Amer: >60 mL/min (ref >60)
Glucose, Bld: 110 mg/dL — ABNORMAL HIGH (ref 65–99)
Potassium: 4.4 mmol/L (ref 3.5–5.1)
Sodium: 138 mmol/L (ref 135–145)
Total Bilirubin: 0.5 mg/dL (ref 0.3–1.2)
Total Protein: 7.5 g/dL (ref 6.5–8.1)

## 2016-03-03 LAB — CBC
HCT: 40.7 % (ref 36.0–46.0)
Hemoglobin: 13.7 g/dL (ref 12.0–15.0)
MCH: 29.8 pg (ref 26.0–34.0)
MCHC: 33.7 g/dL (ref 30.0–36.0)
MCV: 88.7 fL (ref 78.0–100.0)
Platelets: 344 10*3/uL (ref 150–400)
RBC: 4.59 MIL/uL (ref 3.87–5.11)
RDW: 12.7 % (ref 11.5–15.5)
WBC: 7.9 10*3/uL (ref 4.0–10.5)

## 2016-03-03 LAB — URINALYSIS, ROUTINE W REFLEX MICROSCOPIC
Bilirubin Urine: NEGATIVE
Glucose, UA: NEGATIVE mg/dL
Hgb urine dipstick: NEGATIVE
Ketones, ur: NEGATIVE mg/dL
Leukocytes, UA: NEGATIVE
Nitrite: NEGATIVE
Protein, ur: NEGATIVE mg/dL
Specific Gravity, Urine: 1.01 (ref 1.005–1.030)
pH: 6 (ref 5.0–8.0)

## 2016-03-03 LAB — LIPASE, BLOOD: Lipase: 19 U/L (ref 11–51)

## 2016-03-03 NOTE — ED Triage Notes (Signed)
Patient complains of acute episode of right sided abdominal pain. States that she went to urgent care and was sent to ED for further evaluation. Nausea but pain has resolved

## 2016-03-03 NOTE — Discharge Instructions (Signed)

## 2016-03-03 NOTE — ED Triage Notes (Signed)
Pain started about 1 1/2 hours ago.  Pain in right upper quadrant.  Patient has nausea and vomiting but patient relates this to goiter disease.  No change in n/v since right upper quadrant pain started .  Intermittent pain, sharp pain.  Last bm was this morning and normal for patient .  Patient denies urinary symptoms.

## 2016-03-03 NOTE — ED Notes (Signed)
Pt stable, understands discharge instructions, and reasons for return.   

## 2016-03-03 NOTE — ED Provider Notes (Signed)
Emergency Department Provider Note   I have reviewed the triage vital signs and the nursing notes.   HISTORY  Chief Complaint Abdominal Pain   HPI Kristy Walter is a 45 y.o. female with PMH of GERD, asthma, anemia, and seasonal allergies presents to the emergency department for evaluation of intermittent right-sided abdominal pain. She describes a sudden severe episode of right-sided pain that began without obvious provocation. She is getting intermittent pain on the right side that lasts for several minutes and then resolve spontaneously. She denies any back or flank pain. The patient has had a cholecystectomy in the past. She denies associated nausea, vomiting, diarrhea. No chest pain or difficulty breathing. She denies any hematuria. The patient went to urgent care and was referred to the emergency department for lab work and further evaluation of the abdominal pain. Patient does note a remote history of kidney stone in the setting of pregnancy with her and states that the pain she is expressing today is unlike that pain.   Past Medical History:  Diagnosis Date  . Anemia    no iron  . Asthma    no inhaler  . GERD (gastroesophageal reflux disease)   . Seasonal allergies     Patient Active Problem List   Diagnosis Date Noted  . Status post complete hysterectomy 06/15/2012    Past Surgical History:  Procedure Laterality Date  . BILATERAL SALPINGECTOMY Bilateral 06/15/2012   Procedure: BILATERAL SALPINGECTOMY;  Surgeon: Lovenia Kim, MD;  Location: [facility];  Service: Gynecology;  Laterality: Bilateral;  BILATERAL SALPINGECTOMY  . BREAST ENHANCEMENT SURGERY    . CHOLECYSTECTOMY    . CLAVICLE SURGERY    . NASAL SINUS SURGERY    . ROBOTIC ASSISTED TOTAL HYSTERECTOMY N/A 06/15/2012   Procedure: ROBOTIC ASSISTED TOTAL HYSTERECTOMY;  Surgeon: Lovenia Kim, MD;  Location: [facility];  Service: Gynecology;  Laterality: N/A;  ROBOTIC ASSISTED TOTAL HYSTERECTOMY  . TONSILLECTOMY       Current Outpatient Rx  . Order #: [id number] Class: Normal  . Order #: [id number] Class: Historical Med  . Order #: [id number] Class: Historical Med  . Order #: [id number] Class: Print  . Order #: [id number] Class: Historical Med  . Order #: [id number] Class: Historical Med  . Order #: [id number] Class: Normal    Allergies Sulfa antibiotics and Keflex [cephalexin]  Family History  Problem Relation Age of Onset  . Cancer Mother   . Cervical cancer Sister   . Allergic rhinitis Brother   . Allergic rhinitis Son     Social History Social History  Substance Use Topics  . Smoking status: Never Smoker  . Smokeless tobacco: Never Used  . Alcohol use Yes     Comment: rarely    Review of Systems  Constitutional: No fever/chills Eyes: No visual changes. ENT: No sore throat. Cardiovascular: Denies chest pain. Respiratory: Denies shortness of breath. Gastrointestinal: Right sided abdominal pain.  No nausea, no vomiting.  No diarrhea.  No constipation. Genitourinary: Negative for dysuria.  Musculoskeletal: Negative for back pain. Skin: Negative for rash. Neurological: Negative for headaches, focal weakness or numbness.  10-point ROS otherwise negative.  ____________________________________________   PHYSICAL EXAM:  VITAL SIGNS: ED Triage Vitals  Enc Vitals Group     BP 03/03/16 1215 115/70     Pulse Rate 03/03/16 1215 81     Resp 03/03/16 1215 18     Temp 03/03/16 1215 97.8 F (36.6 C)     Temp Source 03/03/16 1215 Oral     SpO2 03/03/16  1215 98 %     Weight 03/03/16 1215 171 lb (77.6 kg)     Height 03/03/16 1215 5' 7.5" (1.715 m)     Pain Score 03/03/16 1221 1   Constitutional: Alert and oriented. Well appearing and in no acute distress. Eyes: Conjunctivae are normal.  Head: Atraumatic. Nose: No congestion/rhinnorhea. Mouth/Throat: Mucous membranes are moist.  Oropharynx non-erythematous. Neck: No stridor. Cardiovascular: Normal rate, regular rhythm. Good peripheral  circulation. Grossly normal heart sounds.   Respiratory: Normal respiratory effort.  No retractions. Lungs CTAB. Gastrointestinal: Soft with very mild RUQ abdominal pain. No rebound or guarding. No distention.  Musculoskeletal: No lower extremity tenderness nor edema. No gross deformities of extremities. Neurologic:  Normal speech and language. No gross focal neurologic deficits are appreciated.  Skin:  Skin is warm, dry and intact. No rash noted.  ____________________________________________   LABS (all labs ordered are listed, but only abnormal results are displayed)  Labs Reviewed  COMPREHENSIVE METABOLIC PANEL - Abnormal; Notable for the following:       Result Value   Glucose, Bld 110 (*)    BUN 5 (*)    All other components within normal limits  LIPASE, BLOOD  CBC  URINALYSIS, ROUTINE W REFLEX MICROSCOPIC   ____________________________________________  RADIOLOGY  None ____________________________________________   PROCEDURES  Procedure(s) performed:   Procedures  None ____________________________________________   INITIAL IMPRESSION / ASSESSMENT AND PLAN / ED COURSE  Pertinent labs & imaging results that were available during my care of the patient were reviewed by me and considered in my medical decision making (see chart for details).  Patient presents emergency department for evaluation of right upper quadrant abdominal pain that began suddenly today. She is having intermittent pain in that area. She's had a cholecystectomy before. The patient is very well-appearing and sitting on the edge of the bed on my initial exam. Very mild tenderness in the right abdomen with no rebound. No CVA tenderness.   Differential diagnosis includes but is not exclusive to ectopic pregnancy, ovarian cyst, ovarian torsion, acute appendicitis, urinary tract infection, endometriosis, bowel obstruction, hernia, colitis, renal colic, gastroenteritis, volvulus etc.  Discussed the  patient's lab work which is unremarkable. Patient states that she is feeling better with only mild occasional pain. Given her intermittent symptoms I offered CT abdomen pelvis renal protocol vs KUB vs watch and wait since symptoms are largely resolved with normal labs. The patient would prefer to watch and wait with GI and PCP follow up along with strict return precautions. Discussed warning signs of nephrolithiasis and/or early appendicitis. She will return PRN. Patient does not want pain or nausea medication at home.   At this time, I do not feel there is any life-threatening condition present. I have reviewed and discussed all results (EKG, imaging, lab, urine as appropriate), exam findings with patient. I have reviewed nursing notes and appropriate previous records.  I feel the patient is safe to be discharged home without further emergent workup. Discussed usual and customary return precautions. Patient and family (if present) verbalize understanding and are comfortable with this plan.  Patient will follow-up with their primary care provider. If they do not have a primary care provider, information for follow-up has been provided to them. All questions have been answered.  ____________________________________________  FINAL CLINICAL IMPRESSION(S) / ED DIAGNOSES  Final diagnoses:  Right upper quadrant abdominal pain     MEDICATIONS GIVEN DURING THIS VISIT:  None  NEW OUTPATIENT MEDICATIONS STARTED DURING THIS VISIT:  None  Note:  This document was prepared using Dragon voice recognition software and may include unintentional dictation errors.  Nanda Quinton, MD Emergency Medicine   Margette Fast, MD 03/03/16 [phone]

## 2016-03-03 NOTE — ED Provider Notes (Signed)
CSN: [id number]     Arrival date & time 03/03/16  1123 History   First MD Initiated Contact with Patient 03/03/16 1155     Chief Complaint  Patient presents with  . Abdominal Pain   (Consider location/radiation/quality/duration/timing/severity/associated sxs/prior Treatment) Mrs. Clarin is a well-appearing 45 y.o female with history of asthma, GERD, anemia, cholecystectomy, presents today for acute intermittent sharp 10/10 right upper quadrant pain onset 1.5 hour ago. Patient is a Engineer, site, she was talking to a patient and suddenly noticed a sharp stabbing pain at the RUQ area. The pain comes and goes and lasts about 10 minutes each time. She also reports nausea but believes her nausea is coming from her hyperthyroidism. She have felt nauseous for the past 3 months possibly due to hyperthyroidism. Her gastroenterologist ordered her a CT ABD,  but patient have not had the CT scan scheduled yet. Patient denies vomiting, diarrhea, constipation, vaginal discharge, urinary symptoms. Patient denies fever and appetite changes. Patient states that she normally don't go to the doctor's office unless she is really sick.    The history is provided by the patient.    Past Medical History:  Diagnosis Date  . Anemia    no iron  . Asthma    no inhaler  . GERD (gastroesophageal reflux disease)   . Seasonal allergies    Past Surgical History:  Procedure Laterality Date  . BILATERAL SALPINGECTOMY Bilateral 06/15/2012   Procedure: BILATERAL SALPINGECTOMY;  Surgeon: Lovenia Kim, MD;  Location: [facility];  Service: Gynecology;  Laterality: Bilateral;  BILATERAL SALPINGECTOMY  . BREAST ENHANCEMENT SURGERY    . CHOLECYSTECTOMY    . CLAVICLE SURGERY    . NASAL SINUS SURGERY    . ROBOTIC ASSISTED TOTAL HYSTERECTOMY N/A 06/15/2012   Procedure: ROBOTIC ASSISTED TOTAL HYSTERECTOMY;  Surgeon: Lovenia Kim, MD;  Location: [facility];  Service: Gynecology;  Laterality: N/A;  ROBOTIC ASSISTED TOTAL  HYSTERECTOMY  . TONSILLECTOMY     Family History  Problem Relation Age of Onset  . Cancer Mother   . Cervical cancer Sister   . Allergic rhinitis Brother   . Allergic rhinitis Son    Social History  Substance Use Topics  . Smoking status: Never Smoker  . Smokeless tobacco: Never Used  . Alcohol use Yes     Comment: rarely   OB History    No data available     Review of Systems  Constitutional: Negative for appetite change, chills, fatigue and fever.  Respiratory: Negative for cough, chest tightness and shortness of breath.   Cardiovascular: Negative for chest pain and palpitations.  Gastrointestinal: Positive for abdominal pain and nausea. Negative for constipation, diarrhea and vomiting.  Genitourinary: Negative for dysuria, flank pain, frequency and vaginal discharge.  Neurological: Negative for dizziness and headaches.    Allergies  Sulfa antibiotics and Keflex [cephalexin]  Home Medications   Prior to Admission medications   Medication Sig Start Date End Date Taking? Authorizing Provider  albuterol (PROAIR HFA) 108 (90 Base) MCG/ACT inhaler INHALE TWO PUFFS EVERY 4-6 HOURS IF NEEDED FOR COUGH OR WHEEZE. 06/10/15   Jiles Prows, MD  BELVIQ 10 MG TABS Take 1 tablet by mouth 2 (two) times daily. 06/03/15   Historical Provider, MD  cetirizine (ZYRTEC) 10 MG tablet Take 10 mg by mouth daily.    Historical Provider, MD  ciprofloxacin (CIPRO) 500 MG tablet Take 1 tablet (500 mg total) by mouth 2 (two) times daily. Patient not taking: Reported on 03/03/2016 01/26/16  Robyn Haber, MD  dexlansoprazole (DEXILANT) 60 MG capsule Take 60 mg by mouth daily.    Historical Provider, MD  diphenhydrAMINE (BENADRYL) 25 mg capsule Take 25 mg by mouth every 6 (six) hours as needed.    Historical Provider, MD  montelukast (SINGULAIR) 10 MG tablet TAKE ONE TABLET ONCE DAILY AS DIRECTED 09/18/15   Jiles Prows, MD   Meds Ordered and Administered this Visit  Medications - No data to  display  BP 119/79 (BP Location: Right Arm)   Pulse 83   Temp 97.9 F (36.6 C) (Oral)   Resp 18   LMP  (Exact Date)   SpO2 97%  No data found.   Physical Exam  Constitutional: She is oriented to person, place, and time. She appears well-developed and well-nourished.  HENT:  Head: Normocephalic and atraumatic.  Right Ear: External ear normal.  Left Ear: External ear normal.  Nose: Nose normal.  Mouth/Throat: Oropharynx is clear and moist. No oropharyngeal exudate.  Eyes: EOM are normal. Pupils are equal, round, and reactive to light.  Neck: Normal range of motion. Neck supple.  Cardiovascular: Normal rate, regular rhythm and normal heart sounds.   Pulmonary/Chest: Effort normal and breath sounds normal.  Abdominal: Soft. Bowel sounds are normal. She exhibits no distension and no mass. There is tenderness. There is rebound and guarding. No hernia.  +rebound tenderness at the RUQ, +psoas sign  Musculoskeletal: Normal range of motion.  Lymphadenopathy:    She has no cervical adenopathy.  Neurological: She is alert and oriented to person, place, and time.  Skin: Skin is warm and dry.  Nursing note and vitals reviewed.   Urgent Care Course   Clinical Course     Procedures (including critical care time)  Labs Review Labs Reviewed - No data to display  Imaging Review No results found.  MDM   1. Right upper quadrant abdominal pain    No clear etiology for her abdominal pain at the current moment. Patient transferred to the ER for further comprehensive evaluation due to patient reporting that her pain onset was acute and is very unusual for her. Patient also states the pain was severe enough for her to seek medical attention mentioning that she normally don't seek medical attention unless if she really needs to.     Barry Dienes, NP 03/04/16 1623

## 2016-03-11 ENCOUNTER — Other Ambulatory Visit: Payer: Self-pay | Admitting: Gastroenterology

## 2016-03-11 DIAGNOSIS — R1011 Right upper quadrant pain: Secondary | ICD-10-CM

## 2016-03-19 ENCOUNTER — Other Ambulatory Visit: Payer: 59

## 2016-03-25 ENCOUNTER — Ambulatory Visit
Admission: RE | Admit: 2016-03-25 | Discharge: 2016-03-25 | Disposition: A | Discharge status: Home / Self Care | Payer: 59 | Source: Ambulatory Visit | Attending: Gastroenterology | Admitting: Gastroenterology

## 2016-03-25 DIAGNOSIS — R1084 Generalized abdominal pain: Secondary | ICD-10-CM

## 2016-03-25 MED ORDER — IOPAMIDOL (ISOVUE-300) INJECTION 61%
100.0000 mL | Freq: Once | INTRAVENOUS | Status: AC | PRN
  Administered 2016-03-25: 100 mL via INTRAVENOUS

## 2016-04-08 DIAGNOSIS — L309 Dermatitis, unspecified: Secondary | ICD-10-CM | POA: Diagnosis not present

## 2016-04-08 DIAGNOSIS — L570 Actinic keratosis: Secondary | ICD-10-CM | POA: Diagnosis not present

## 2016-04-13 DIAGNOSIS — E0590 Thyrotoxicosis, unspecified without thyrotoxic crisis or storm: Secondary | ICD-10-CM | POA: Diagnosis not present

## 2016-04-22 ENCOUNTER — Other Ambulatory Visit: Payer: Self-pay | Admitting: Allergy and Immunology

## 2016-04-28 DIAGNOSIS — L570 Actinic keratosis: Secondary | ICD-10-CM | POA: Diagnosis not present

## 2016-05-03 DIAGNOSIS — R0781 Pleurodynia: Secondary | ICD-10-CM | POA: Diagnosis not present

## 2016-05-17 DIAGNOSIS — E0590 Thyrotoxicosis, unspecified without thyrotoxic crisis or storm: Secondary | ICD-10-CM | POA: Diagnosis not present

## 2016-05-21 DIAGNOSIS — J329 Chronic sinusitis, unspecified: Secondary | ICD-10-CM | POA: Diagnosis not present

## 2016-05-21 DIAGNOSIS — J3489 Other specified disorders of nose and nasal sinuses: Secondary | ICD-10-CM | POA: Diagnosis not present

## 2016-05-21 DIAGNOSIS — J029 Acute pharyngitis, unspecified: Secondary | ICD-10-CM | POA: Diagnosis not present

## 2016-06-02 DIAGNOSIS — E032 Hypothyroidism due to medicaments and other exogenous substances: Secondary | ICD-10-CM | POA: Diagnosis not present

## 2016-06-15 ENCOUNTER — Ambulatory Visit: Payer: 59 | Admitting: Allergy and Immunology

## 2016-06-22 ENCOUNTER — Other Ambulatory Visit: Payer: Self-pay | Admitting: Allergy and Immunology

## 2016-06-22 MED ORDER — MONTELUKAST SODIUM 10 MG PO TABS: ORAL_TABLET | ORAL | 0 refills | Status: DC

## 2016-06-22 NOTE — Telephone Encounter (Signed)
Patient has an appointment on 07-06-16 with Dr. Neldon Mc. She would like to have a refill on her Singulair to get her through to this appt. [facility].

## 2016-06-25 ENCOUNTER — Ambulatory Visit (INDEPENDENT_AMBULATORY_CARE_PROVIDER_SITE_OTHER): Payer: 59

## 2016-06-25 ENCOUNTER — Ambulatory Visit (INDEPENDENT_AMBULATORY_CARE_PROVIDER_SITE_OTHER): Payer: 59 | Admitting: Family Medicine

## 2016-06-25 VITALS — BP 121/76 | HR 74 | Temp 97.9°F | Resp 16 | Ht 67.5 in | Wt 178.0 lb

## 2016-06-25 DIAGNOSIS — M25552 Pain in left hip: Secondary | ICD-10-CM

## 2016-06-25 DIAGNOSIS — S73102A Unspecified sprain of left hip, initial encounter: Secondary | ICD-10-CM | POA: Diagnosis not present

## 2016-06-25 MED ORDER — METHOCARBAMOL 500 MG PO TABS: 500.0000 mg | ORAL_TABLET | Freq: Four times a day (QID) | ORAL | 0 refills | Status: DC | PRN

## 2016-06-25 MED ORDER — TRAMADOL HCL 50 MG PO TABS: 50.0000 mg | ORAL_TABLET | Freq: Four times a day (QID) | ORAL | 0 refills | Status: DC | PRN

## 2016-06-25 MED ORDER — KETOROLAC TROMETHAMINE 60 MG/2ML IM SOLN
60.0000 mg | Freq: Once | INTRAMUSCULAR | Status: AC
  Administered 2016-06-25: 60 mg via INTRAMUSCULAR

## 2016-06-25 NOTE — Progress Notes (Signed)
Subjective:    Patient ID: Kristy Walter, female    DOB: 01-26-1971, 46 y.o.   MRN: [id number]  06/25/2016  Hip Pain (Left, X this morning. Sharp pain that radiates down leg)   HPI This 46 y.o. female presents for evaluation of LEFT hip pain.  Started running one month ago.  Awoke last night with exruciating pain in LEFT hip.  Went to get Motrin; two hours later got up.  Woke up at 5:00am.  Feels really wrong.  Yesterday was a normal day.  Intense pain.  Trying to get up is horrible Constant horrible pain.  Propping up against something.  Lateral hip pain LEFT.  [facility]. Deep pain.  Radiates to thigh if sitting on chair.  Vibration/numbness intermittently thigh.  Horrible limp.  Getting in and out of car is intense; Ibuprofen 800mg  at 10:00.  Ran three miles 48 hours ago.  Slept on L side last night because husband has a virus.  Last night, slept on L side last night because history of cervical injury with radiculopathy.  PCP: [facility]   Review of Systems  Constitutional: Negative for chills, diaphoresis, fatigue and fever.  HENT: Negative for ear pain, postnasal drip, rhinorrhea, sinus pressure, sore throat and trouble swallowing.   Respiratory: Negative for cough and shortness of breath.   Cardiovascular: Negative for chest pain, palpitations and leg swelling.  Gastrointestinal: Negative for abdominal pain, constipation, diarrhea, nausea and vomiting.  Genitourinary: Negative for decreased urine volume and difficulty urinating.  Musculoskeletal: Positive for arthralgias, gait problem and myalgias. Negative for back pain.  Neurological: Positive for numbness. Negative for weakness.    Past Medical History:  Diagnosis Date  . Anemia    no iron  . Asthma    no inhaler  . GERD (gastroesophageal reflux disease)   . Seasonal allergies   . Thyroid disease    Past Surgical History:  Procedure Laterality Date  . BILATERAL SALPINGECTOMY Bilateral 06/15/2012   Procedure:  BILATERAL SALPINGECTOMY;  Surgeon: Lovenia Kim, MD;  Location: [facility];  Service: Gynecology;  Laterality: Bilateral;  BILATERAL SALPINGECTOMY  . BREAST ENHANCEMENT SURGERY    . CHOLECYSTECTOMY    . CLAVICLE SURGERY    . NASAL SINUS SURGERY    . ROBOTIC ASSISTED TOTAL HYSTERECTOMY N/A 06/15/2012   Procedure: ROBOTIC ASSISTED TOTAL HYSTERECTOMY;  Surgeon: Lovenia Kim, MD;  Location: [facility];  Service: Gynecology;  Laterality: N/A;  ROBOTIC ASSISTED TOTAL HYSTERECTOMY  . TONSILLECTOMY     Allergies  Allergen Reactions  . Sulfa Antibiotics Hives and Rash  . Keflex [Cephalexin]     Social History   Social History  . Marital status: Married    Spouse name: N/A  . Number of children: N/A  . Years of education: N/A   Occupational History  . Not on file.   Social History Main Topics  . Smoking status: Never Smoker  . Smokeless tobacco: Never Used  . Alcohol use Yes     Comment: rarely  . Drug use: No  . Sexual activity: Not on file   Other Topics Concern  . Not on file   Social History Narrative  . No narrative on file   Family History  Problem Relation Age of Onset  . Cancer Mother   . Cervical cancer Sister   . Allergic rhinitis Brother   . Allergic rhinitis Son        Objective:    BP 121/76   Pulse 74   Temp  97.9 F (36.6 C) (Oral)   Resp 16   Ht 5' 7.5" (1.715 m)   Wt 178 lb (80.7 kg)   LMP 05/31/2012   SpO2 99%   BMI 27.47 kg/m  Physical Exam  Constitutional: She is oriented to person, place, and time. She appears well-developed and well-nourished. She appears distressed.  Distressed during exam due to pain.  HENT:  Head: Normocephalic and atraumatic.  Eyes: Conjunctivae are normal. Pupils are equal, round, and reactive to light.  Neck: Normal range of motion. Neck supple.  Cardiovascular: Normal rate, regular rhythm, normal heart sounds and intact distal pulses.  Exam reveals no gallop and no friction rub.   No murmur  heard. Pulmonary/Chest: Effort normal and breath sounds normal. She has no wheezes. She has no rales.  Musculoskeletal:       Left hip: She exhibits decreased range of motion, decreased strength and tenderness. She exhibits no bony tenderness, no swelling, no crepitus, no deformity and no laceration.       Left knee: Normal. She exhibits normal range of motion. No tenderness found.       Left ankle: Normal.       Lumbar back: She exhibits decreased range of motion. She exhibits no tenderness, no bony tenderness, no swelling, no pain and no spasm.       Left upper leg: She exhibits no tenderness, no bony tenderness, no swelling, no edema, no deformity and no laceration.       Left lower leg: Normal. She exhibits no tenderness, no bony tenderness and no swelling.  Neurological: She is alert and oriented to person, place, and time.  Skin: No rash noted. She is not diaphoretic.  Psychiatric: She has a normal mood and affect. Her behavior is normal.  Nursing note and vitals reviewed.       Assessment & Plan:   1. Left hip pain   2. Hip sprain, left, initial encounter    -New onset; acute onset of hip pain without injury. -s/p Toradol injection in office. -s/p L hip films and lumbar spine films; no acute process identified. -rx for Tramadol and Robaxin provided. -recommend rest, icing or heat, NSAIDs.   -recommend evaluation by ortho in upcoming 3-5 days. -pt declined crutches over the weekend to assist with ambulation.   Orders Placed This Encounter  Procedures  . DG HIP UNILAT W OR W/O PELVIS 2-3 VIEWS LEFT    Standing Status:   Future    Number of Occurrences:   1    Standing Expiration Date:   06/25/2017    Order Specific Question:   Reason for Exam (SYMPTOM  OR DIAGNOSIS REQUIRED)    Answer:   Severe L hip pain; no trauma; worsening pain with weightbearing; pain radiating into thigh    Order Specific Question:   Is the patient pregnant?    Answer:   No    Comments:    hysterectomy    Order Specific Question:   Preferred imaging location?    Answer:   External  . DG Lumbar Spine 2-3 Views    Standing Status:   Future    Number of Occurrences:   1    Standing Expiration Date:   06/25/2017    Order Specific Question:   Reason for Exam (SYMPTOM  OR DIAGNOSIS REQUIRED)    Answer:   Severe L hip pain; no trauma; worsening pain with weightbearing; pain radiating into thigh    Order Specific Question:   Is the patient  pregnant?    Answer:   No    Comments:   hysterectomy    Order Specific Question:   Preferred imaging location?    Answer:   External   Meds ordered this encounter  Medications  . levothyroxine (SYNTHROID, LEVOTHROID) 100 MCG tablet    Sig: Take 100 mcg by mouth daily before breakfast.  . TURMERIC PO    Sig: Take by mouth.  . furosemide (LASIX) 20 MG tablet    Sig: Take 20 mg by mouth.  . methocarbamol (ROBAXIN) 500 MG tablet    Sig: Take 1-2 tablets (500-1,000 mg total) by mouth every 6 (six) hours as needed for muscle spasms.    Dispense:  45 tablet    Refill:  0  . traMADol (ULTRAM) 50 MG tablet    Sig: Take 1-2 tablets (50-100 mg total) by mouth every 6 (six) hours as needed.    Dispense:  45 tablet    Refill:  0  . ketorolac (TORADOL) injection 60 mg    No Follow-up on file.   Daleysa Kristiansen Elayne Guerin, M.D. Primary Care at [facility] previously [facility] [address] [place], [place]  [address] [phone] phone [phone] fax

## 2016-06-25 NOTE — Patient Instructions (Addendum)
IF you received an x-ray today, you will receive an invoice from [facility]. Please contact [facility] at [phone] with questions or concerns regarding your invoice.   IF you received labwork today, you will receive an invoice from [facility]. Please contact [facility] at [phone] with questions or concerns regarding your invoice.   Our billing staff will not be able to assist you with questions regarding bills from these companies.  You will be contacted with the lab results as soon as they are available. The fastest way to get your results is to activate your My Chart account. Instructions are located on the last page of this paperwork. If you have not heard from [place] regarding the results in 2 weeks, please contact this office.      Piriformis Syndrome Piriformis syndrome is a condition that can cause pain and numbness in your buttocks and down the back of your leg. Piriformis syndrome happens when the small muscle that connects the base of your spine to your hip (piriformis muscle) presses on the nerve that runs down the back of your leg (sciatic nerve). The piriformis muscle helps your hip rotate and helps to bring your leg back and out. It also helps shift your weight while you are walking to keep you stable. The sciatic nerve runs under or through the piriformis. Damage to the piriformis muscle can cause spasms that put pressure on the nerve below. This causes pain and discomfort while sitting and moving. The pain may feel as if it begins in the buttock and spreads (radiates) down your hip and thigh. What are the causes? This condition is caused by pressure on the sciatic nerve from the piriformis muscle. The piriformis muscle can get irritated with overuse, especially if other hip muscles are weak and the piriformis has to do extra work. Piriformis syndrome can also occur after an injury, like a fall onto your buttocks. What increases the risk? This condition is  more likely to develop in:  Women.  People who sit for long periods of time.  Cyclists.  People who have weak buttocks muscles (gluteal muscles). What are the signs or symptoms? Pain, tingling, or numbness that starts in the buttock and runs down the back of your leg (sciatica) is the most common symptom of this condition. Your symptoms may:  Get worse the longer you sit.  Get worse when you walk, run, or go up on stairs. How is this diagnosed? This condition is diagnosed based on your symptoms, medical history, and physical exam. During this exam, your health care provider may move your leg into different positions to check for pain. He or she will also press on the muscles of your hip and buttock to see if that increases your symptoms. You may also have an X-ray or MRI. How is this treated? Treatment for this condition may include:  Stopping all activities that cause pain or make your condition worse.  Using heat or ice to relieve pain as told by your health care provider.  Taking medicines to reduce pain and swelling.  Taking a muscle relaxer to release the piriformis muscle.  Doing range-of-motion and strengthening exercises (physical therapy) as told by your health care provider.  Massaging the affected area.  Getting an injection of an anti-inflammatory medicine or muscle relaxer to reduce inflammation and muscle tension. In rare cases, you may need surgery to cut the muscle and release pressure on the nerve if other treatments do not work. Follow these instructions  at home:  Take over-the-counter and prescription medicines only as told by your health care provider.  Do not sit for long periods. Get up and walk around every 20 minutes or as often as told by your health care provider.  If directed, apply heat to the affected area as often as told by your health care provider. Use the heat source that your health care provider recommends, such as a moist heat pack or a  heating pad.  Place a towel between your skin and the heat source.  Leave the heat on for 20-30 minutes.  Remove the heat if your skin turns bright red. This is especially important if you are unable to feel pain, heat, or cold. You may have a greater risk of getting burned.  If directed, apply ice to the injured area.  Put ice in a plastic bag.  Place a towel between your skin and the bag.  Leave the ice on for 20 minutes, 2-3 times a day.  Do exercises as told by your health care provider.  Return to your normal activities as told by your health care provider. Ask your health care provider what activities are safe for you.  Keep all follow-up visits as told by your health care provider. This is important. How is this prevented?  Do not sit for longer than 20 minutes at a time. When you sit, choose padded surfaces.  Warm up and stretch before being active.  Cool down and stretch after being active.  Give your body time to rest between periods of activity.  Make sure to use equipment that fits you.  Maintain physical fitness, including:  Strength.  Flexibility. Contact a health care provider if:  Your pain and stiffness continue or get worse.  Your leg or hip becomes weak.  You have changes in your bowel function or bladder function. This information is not intended to replace advice given to you by your health care provider. Make sure you discuss any questions you have with your health care provider. Document Released: 03/15/2005 Document Revised: 11/18/2015 Document Reviewed: 02/25/2015 Elsevier Interactive Patient Education  2017 [employer].

## 2016-06-26 DIAGNOSIS — R079 Chest pain, unspecified: Secondary | ICD-10-CM | POA: Diagnosis not present

## 2016-06-29 ENCOUNTER — Other Ambulatory Visit: Payer: Self-pay

## 2016-06-29 MED ORDER — MONTELUKAST SODIUM 10 MG PO TABS: ORAL_TABLET | ORAL | 0 refills | Status: DC

## 2016-07-06 ENCOUNTER — Encounter: Payer: Self-pay | Admitting: Allergy and Immunology

## 2016-07-06 ENCOUNTER — Ambulatory Visit (INDEPENDENT_AMBULATORY_CARE_PROVIDER_SITE_OTHER): Payer: 59 | Admitting: Allergy and Immunology

## 2016-07-06 VITALS — BP 118/76 | HR 80 | Resp 12

## 2016-07-06 DIAGNOSIS — H1010 Acute atopic conjunctivitis, unspecified eye: Secondary | ICD-10-CM

## 2016-07-06 DIAGNOSIS — J309 Allergic rhinitis, unspecified: Secondary | ICD-10-CM | POA: Diagnosis not present

## 2016-07-06 DIAGNOSIS — K219 Gastro-esophageal reflux disease without esophagitis: Secondary | ICD-10-CM

## 2016-07-06 DIAGNOSIS — J4520 Mild intermittent asthma, uncomplicated: Secondary | ICD-10-CM | POA: Diagnosis not present

## 2016-07-06 MED ORDER — BEPOTASTINE BESILATE 1.5 % OP SOLN: OPHTHALMIC | 11 refills | Status: DC

## 2016-07-06 MED ORDER — DEXLANSOPRAZOLE 60 MG PO CPDR: DELAYED_RELEASE_CAPSULE | ORAL | 11 refills | Status: DC

## 2016-07-06 MED ORDER — MONTELUKAST SODIUM 10 MG PO TABS: ORAL_TABLET | ORAL | 11 refills | Status: DC

## 2016-07-06 MED ORDER — ALBUTEROL SULFATE HFA 108 (90 BASE) MCG/ACT IN AERS: INHALATION_SPRAY | RESPIRATORY_TRACT | 1 refills | Status: DC

## 2016-07-06 NOTE — Progress Notes (Signed)
Follow-up Note  Referring Provider: Glendale Chard, MD Primary Provider: Maximino Greenland, MD Date of Office Visit: 07/06/2016  Subjective:   Kristy Walter (DOB: 1970/05/22) is a 46 y.o. female who returns to the [facility] on 07/06/2016 in re-evaluation of the following:  HPI: Kristy Walter returns to this clinic in reevaluation of her allergic rhinoconjunctivitis and history of intermittent asthma and very severe reflux with a component of reflux-induced respiratory disease. I last saw her in this clinic approximately one year ago.  She has done very well while consistently using a combination of montelukast and a nasal steroid regarding her upper airway disease. She has not required an antibiotic to treat an episode of sinusitis since I have last seen her in this clinic.  Her asthma has been relatively nonexistent. She runs 5 miles a day with no problem and rarely uses a short acting bronchodilator. She has not required a steroid to treat an asthma exacerbation since I have seen her in his clinic.  Her reflux is still active. If she uses her proton pump inhibitor twice a day she does wonderful. If she uses her proton pump inhibitor one time per day and is very careful about food consumption then she does okay. It is been suggested by her GI doctor that she not use her proton pump inhibitor twice a day.  She did receive the flu vaccine this year.  She had radioactive iodine treatment for a goiter and hyperthyroidism October 2017  Allergies as of 07/06/2016      Reactions   Sulfa Antibiotics Hives, Rash   Keflex [cephalexin]       Medication List      albuterol 108 (90 Base) MCG/ACT inhaler Commonly known as:  PROAIR HFA INHALE TWO PUFFS EVERY 4-6 HOURS IF NEEDED FOR COUGH OR WHEEZE.   BELVIQ 10 MG Tabs Generic drug:  Lorcaserin HCl Take 1 tablet by mouth 2 (two) times daily.   cetirizine 10 MG tablet Commonly known as:  ZYRTEC Take 10 mg by mouth  daily.   DEXILANT 60 MG capsule Generic drug:  dexlansoprazole Take 60 mg by mouth daily.   diphenhydrAMINE 25 mg capsule Commonly known as:  BENADRYL Take 25 mg by mouth every 6 (six) hours as needed.   furosemide 20 MG tablet Commonly known as:  LASIX Take 20 mg by mouth.   levothyroxine 100 MCG tablet Commonly known as:  SYNTHROID, LEVOTHROID Take 100 mcg by mouth daily before breakfast.   montelukast 10 MG tablet Commonly known as:  SINGULAIR TAKE ONE TABLET ONCE DAILY AS DIRECTED   NASONEX 50 MCG/ACT nasal spray Generic drug:  mometasone Place 2 sprays into the nose daily.   traMADol 50 MG tablet Commonly known as:  ULTRAM Take 1-2 tablets (50-100 mg total) by mouth every 6 (six) hours as needed.   TURMERIC PO Take by mouth.       Past Medical History:  Diagnosis Date  . Anemia    no iron  . Asthma    no inhaler  . GERD (gastroesophageal reflux disease)   . Seasonal allergies   . Thyroid disease     Past Surgical History:  Procedure Laterality Date  . BILATERAL SALPINGECTOMY Bilateral 06/15/2012   Procedure: BILATERAL SALPINGECTOMY;  Surgeon: Lovenia Kim, MD;  Location: [facility];  Service: Gynecology;  Laterality: Bilateral;  BILATERAL SALPINGECTOMY  . BREAST ENHANCEMENT SURGERY    . CHOLECYSTECTOMY    . CLAVICLE SURGERY    .  NASAL SINUS SURGERY    . ROBOTIC ASSISTED TOTAL HYSTERECTOMY N/A 06/15/2012   Procedure: ROBOTIC ASSISTED TOTAL HYSTERECTOMY;  Surgeon: Lovenia Kim, MD;  Location: [facility];  Service: Gynecology;  Laterality: N/A;  ROBOTIC ASSISTED TOTAL HYSTERECTOMY  . TONSILLECTOMY      Review of systems negative except as noted in HPI / PMHx or noted below:  Review of Systems  Constitutional: Negative.   HENT: Negative.   Eyes: Negative.   Respiratory: Negative.   Cardiovascular: Negative.   Gastrointestinal: Negative.   Genitourinary: Negative.   Musculoskeletal: Negative.   Skin: Negative.   Neurological: Negative.    Endo/Heme/Allergies: Negative.   Psychiatric/Behavioral: Negative.      Objective:   Vitals:   07/06/16 1140  BP: 118/76  Pulse: 80  Resp: 12          Physical Exam  Constitutional: She is well-developed, well-nourished, and in no distress.  HENT:  Head: Normocephalic.  Right Ear: Tympanic membrane, external ear and ear canal normal.  Left Ear: Tympanic membrane, external ear and ear canal normal.  Nose: Nose normal. No mucosal edema or rhinorrhea.  Mouth/Throat: Uvula is midline, oropharynx is clear and moist and mucous membranes are normal. No oropharyngeal exudate.  Eyes: Conjunctivae are normal.  Neck: Trachea normal. No tracheal tenderness present. No tracheal deviation present. No thyromegaly present.  Cardiovascular: Normal rate, regular rhythm, S1 normal, S2 normal and normal heart sounds.   No murmur heard. Pulmonary/Chest: Breath sounds normal. No stridor. No respiratory distress. She has no wheezes. She has no rales.  Musculoskeletal: She exhibits no edema.  Lymphadenopathy:       Head (right side): No tonsillar adenopathy present.       Head (left side): No tonsillar adenopathy present.    She has no cervical adenopathy.  Neurological: She is alert. Gait normal.  Skin: No rash noted. She is not diaphoretic. No erythema. Nails show no clubbing.  Psychiatric: Mood and affect normal.    Diagnostics:    Spirometry was performed and demonstrated an FEV1 of 2.97 at 102 % of predicted.   Assessment and Plan:   1. Asthma, mild intermittent, well-controlled   2. Allergic rhinoconjunctivitis   3. LPRD (laryngopharyngeal reflux disease)     1. Allergen avoidance measures - dust mite and pollens  2. Treat inflammation:   A. montelukast 10 mg daily  B. OTC Rhinocort one spray each nostril one time per day. Coupon.  3. Treat reflux:   A. Taper off all forms of caffeine slowly  B. continue Dexilant 60 mg 1-2 times per day  4. If needed:   A. OTC  antihistamine  B. nasal saline wash  C. ProAir HFA 2 puffs every 4-6 hours  D. Bepreve 1 drop each eye twice a day. Coupon.  5. Consider a course of immunotherapy  6. Return to clinic in summer 2017 or earlier if problem  Wai appears to be doing very well on her current plan which includes attention to allergen avoidance measures, use of anti-inflammatory agents for her respiratory tract and aggressive therapy directed against reflux. She would definitely be a candidate for immunotherapy and she is presently considering this option. I'll see her back in this clinic in 1 year or earlier if there is a problem.  Allena Katz, MD Allergy / Immunology [place]

## 2016-07-06 NOTE — Patient Instructions (Addendum)
  1. Allergen avoidance measures - dust mite and pollens  2. Treat inflammation:   A. montelukast 10 mg daily  B. OTC Rhinocort/Nasacort one spray each nostril one time per day.    3. Treat reflux:   A. continue to consolidate caffeine  B. continue Dexilant 60 mg 1-2 times a day  4. If needed:   A. OTC antihistamine  B. nasal saline wash  C. ProAir HFA 2 puffs every 4-6 hours  D. Bepreve 1 drop each eye twice a day.    5. Return to clinic in one year or earlier if problem

## 2016-07-14 DIAGNOSIS — E032 Hypothyroidism due to medicaments and other exogenous substances: Secondary | ICD-10-CM | POA: Diagnosis not present

## 2016-07-14 DIAGNOSIS — R609 Edema, unspecified: Secondary | ICD-10-CM | POA: Diagnosis not present

## 2016-09-02 DIAGNOSIS — E032 Hypothyroidism due to medicaments and other exogenous substances: Secondary | ICD-10-CM | POA: Diagnosis not present

## 2016-09-02 LAB — TSH: TSH: 1.05 (ref <=5.90)

## 2016-09-08 ENCOUNTER — Other Ambulatory Visit: Payer: Self-pay | Admitting: Endocrinology

## 2016-09-08 DIAGNOSIS — E0590 Thyrotoxicosis, unspecified without thyrotoxic crisis or storm: Secondary | ICD-10-CM

## 2016-09-15 ENCOUNTER — Other Ambulatory Visit: Payer: 59

## 2016-09-27 ENCOUNTER — Ambulatory Visit
Admission: RE | Admit: 2016-09-27 | Discharge: 2016-09-27 | Disposition: A | Discharge status: Home / Self Care | Payer: 59 | Source: Ambulatory Visit | Attending: Endocrinology | Admitting: Endocrinology

## 2016-09-27 DIAGNOSIS — E0590 Thyrotoxicosis, unspecified without thyrotoxic crisis or storm: Secondary | ICD-10-CM | POA: Diagnosis not present

## 2016-09-30 DIAGNOSIS — R232 Flushing: Secondary | ICD-10-CM | POA: Diagnosis not present

## 2016-09-30 DIAGNOSIS — Z79899 Other long term (current) drug therapy: Secondary | ICD-10-CM | POA: Diagnosis not present

## 2016-09-30 DIAGNOSIS — Z131 Encounter for screening for diabetes mellitus: Secondary | ICD-10-CM | POA: Diagnosis not present

## 2016-09-30 DIAGNOSIS — Z01419 Encounter for gynecological examination (general) (routine) without abnormal findings: Secondary | ICD-10-CM | POA: Diagnosis not present

## 2016-10-06 DIAGNOSIS — K219 Gastro-esophageal reflux disease without esophagitis: Secondary | ICD-10-CM | POA: Diagnosis not present

## 2016-10-06 DIAGNOSIS — D34 Benign neoplasm of thyroid gland: Secondary | ICD-10-CM | POA: Diagnosis not present

## 2016-10-06 DIAGNOSIS — J029 Acute pharyngitis, unspecified: Secondary | ICD-10-CM | POA: Diagnosis not present

## 2016-10-08 DIAGNOSIS — Z780 Asymptomatic menopausal state: Secondary | ICD-10-CM | POA: Diagnosis not present

## 2016-10-08 DIAGNOSIS — Z1231 Encounter for screening mammogram for malignant neoplasm of breast: Secondary | ICD-10-CM | POA: Diagnosis not present

## 2016-12-24 ENCOUNTER — Telehealth: Payer: Self-pay

## 2016-12-24 NOTE — Telephone Encounter (Signed)
We received a fax stating that the pt's Dexilant 60 mg was approved from 11/24/2016-12/24/2018. Pt made aware.

## 2017-01-05 DIAGNOSIS — Z23 Encounter for immunization: Secondary | ICD-10-CM | POA: Diagnosis not present

## 2017-01-25 DIAGNOSIS — E0590 Thyrotoxicosis, unspecified without thyrotoxic crisis or storm: Secondary | ICD-10-CM | POA: Diagnosis not present

## 2017-01-25 DIAGNOSIS — E032 Hypothyroidism due to medicaments and other exogenous substances: Secondary | ICD-10-CM | POA: Diagnosis not present

## 2017-01-25 LAB — TSH: TSH: 1.52 (ref <=5.90)

## 2017-02-01 DIAGNOSIS — E890 Postprocedural hypothyroidism: Secondary | ICD-10-CM | POA: Diagnosis not present

## 2017-03-24 DIAGNOSIS — R232 Flushing: Secondary | ICD-10-CM | POA: Diagnosis not present

## 2017-03-24 DIAGNOSIS — Z780 Asymptomatic menopausal state: Secondary | ICD-10-CM | POA: Diagnosis not present

## 2017-03-24 DIAGNOSIS — Z131 Encounter for screening for diabetes mellitus: Secondary | ICD-10-CM | POA: Diagnosis not present

## 2017-07-19 ENCOUNTER — Ambulatory Visit: Payer: 59 | Admitting: Psychology

## 2017-07-20 DIAGNOSIS — E890 Postprocedural hypothyroidism: Secondary | ICD-10-CM | POA: Diagnosis not present

## 2017-07-20 LAB — TSH: TSH: 2.26 (ref <=5.90)

## 2017-07-26 DIAGNOSIS — J029 Acute pharyngitis, unspecified: Secondary | ICD-10-CM | POA: Diagnosis not present

## 2017-07-26 DIAGNOSIS — J019 Acute sinusitis, unspecified: Secondary | ICD-10-CM | POA: Diagnosis not present

## 2017-07-27 DIAGNOSIS — E890 Postprocedural hypothyroidism: Secondary | ICD-10-CM | POA: Diagnosis not present

## 2017-08-10 ENCOUNTER — Ambulatory Visit: Payer: Self-pay | Admitting: Psychology

## 2017-08-17 ENCOUNTER — Ambulatory Visit (INDEPENDENT_AMBULATORY_CARE_PROVIDER_SITE_OTHER): Payer: 59 | Admitting: Psychology

## 2017-08-17 DIAGNOSIS — F411 Generalized anxiety disorder: Secondary | ICD-10-CM | POA: Diagnosis not present

## 2017-08-18 ENCOUNTER — Encounter: Payer: Self-pay | Admitting: Family Medicine

## 2017-09-17 ENCOUNTER — Other Ambulatory Visit: Payer: Self-pay | Admitting: Allergy and Immunology

## 2017-09-18 IMAGING — US US THYROID
1 series · 13 of 25 positions shown · non-contrast
Comparison: None.

CLINICAL DATA: Hyperthyroid. History of S-RKR therapy for
hyperthyroidism in Monday December, 2015.

Fullness and pain involving the submandibular area of the left side
of the neck with associated difficulty swallowing.
EXAM:
THYROID ULTRASOUND
TECHNIQUE: Ultrasound examination of the thyroid gland and adjacent soft
tissues was performed.

[Series 1: us thyroid · 0.03mm/px · 13 of 48 slices shown]
[im 1/48]
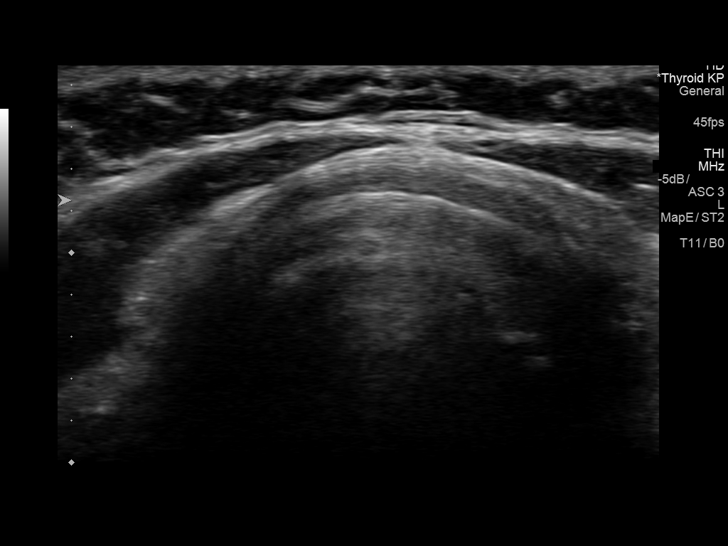
[im 4/48]
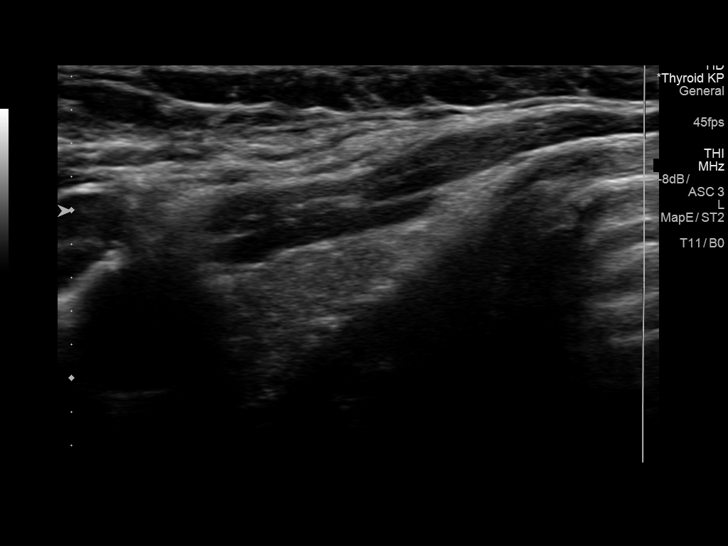
[im 8/48]
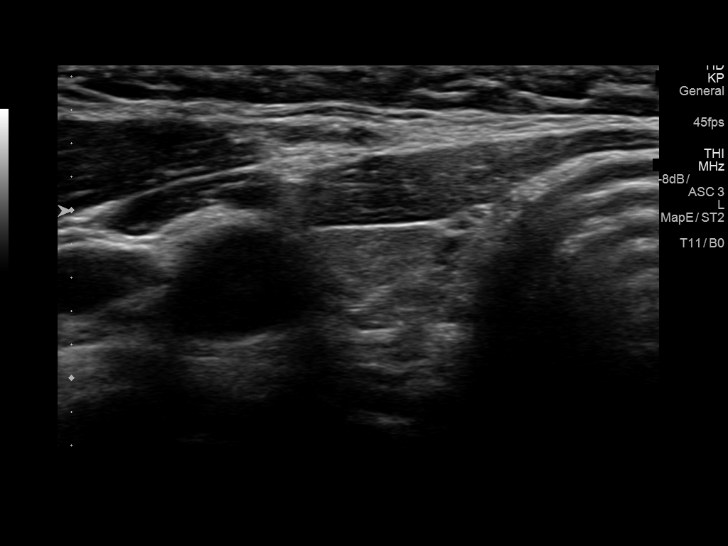
[im 12/48]
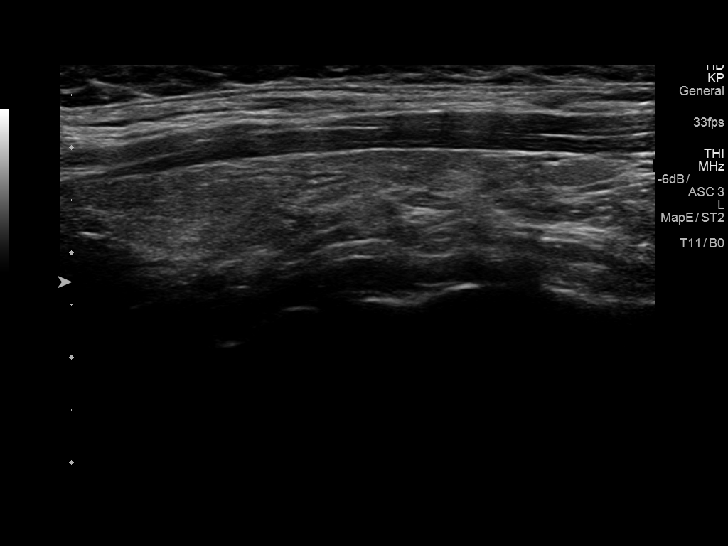
[im 16/48]
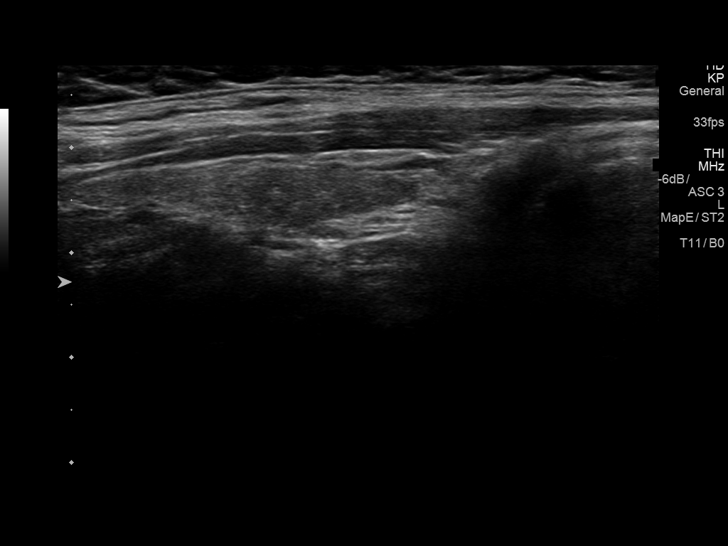
[im 20/48]
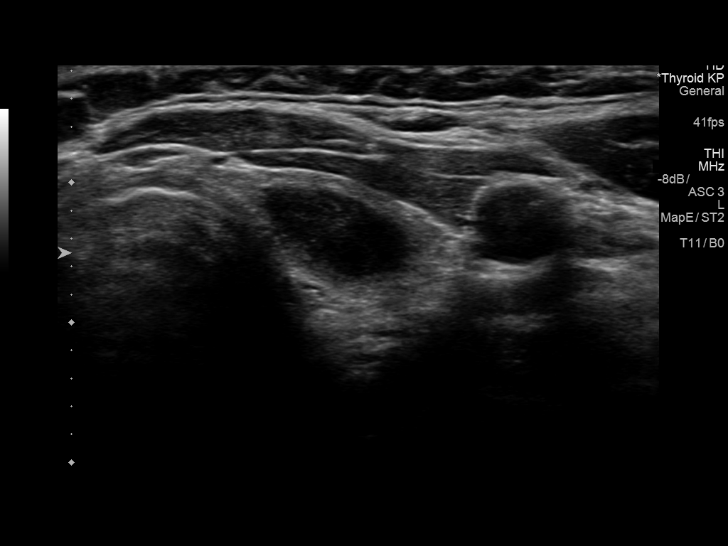
[im 24/48]
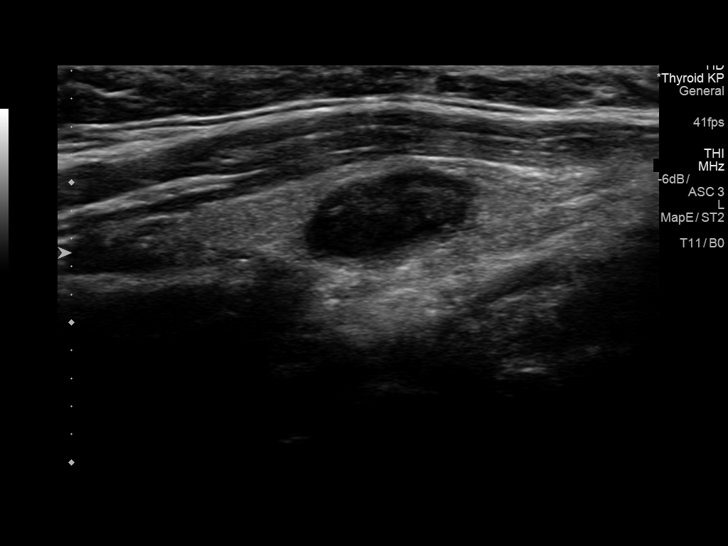
[im 28/48]
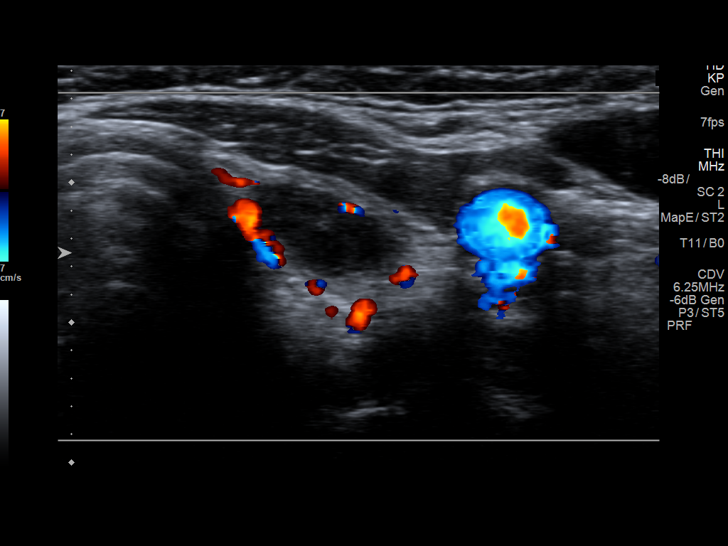
[im 32/48]
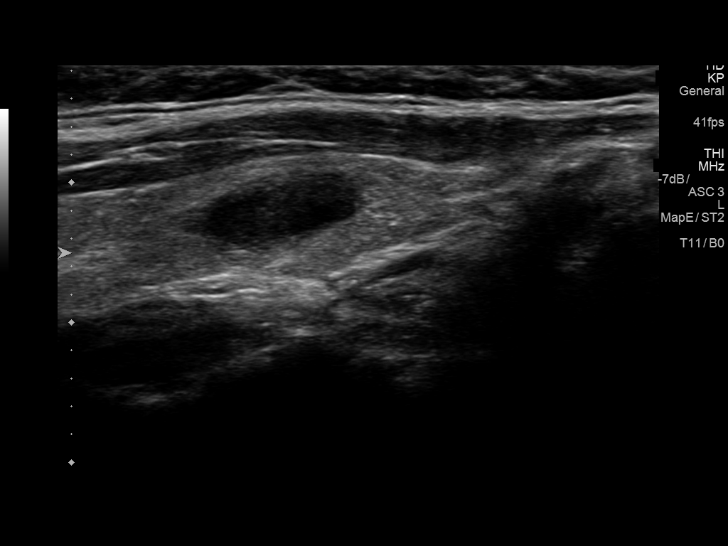
[im 36/48]
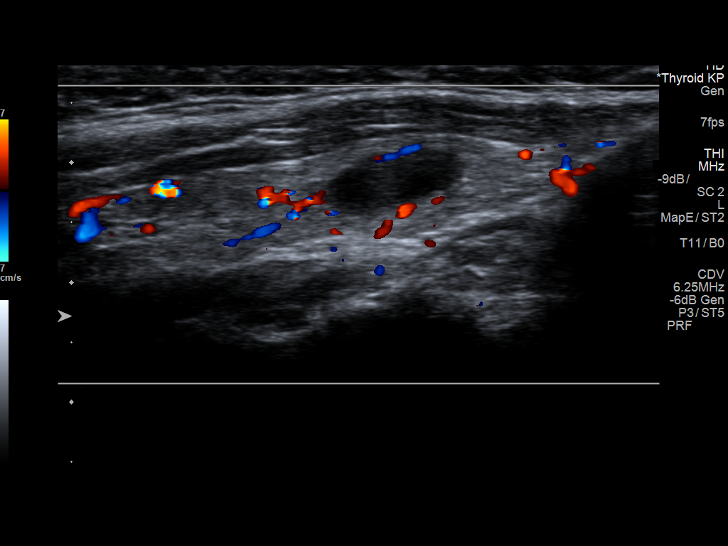
[im 40/48]
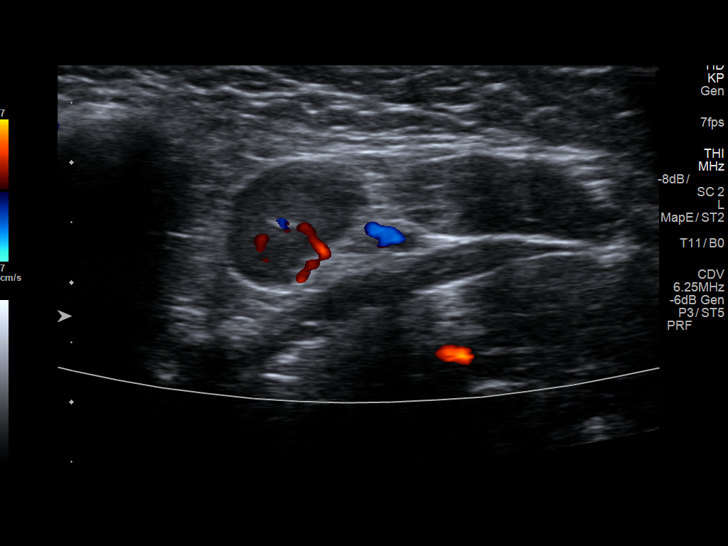
[im 44/48]
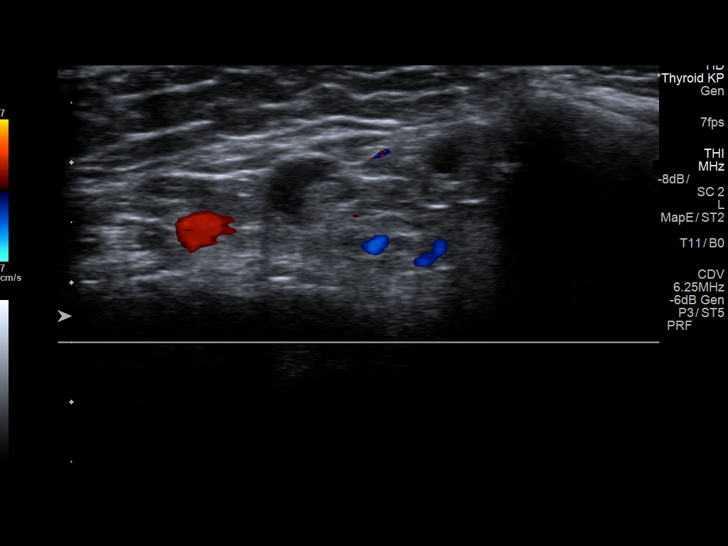
[im 48/48]
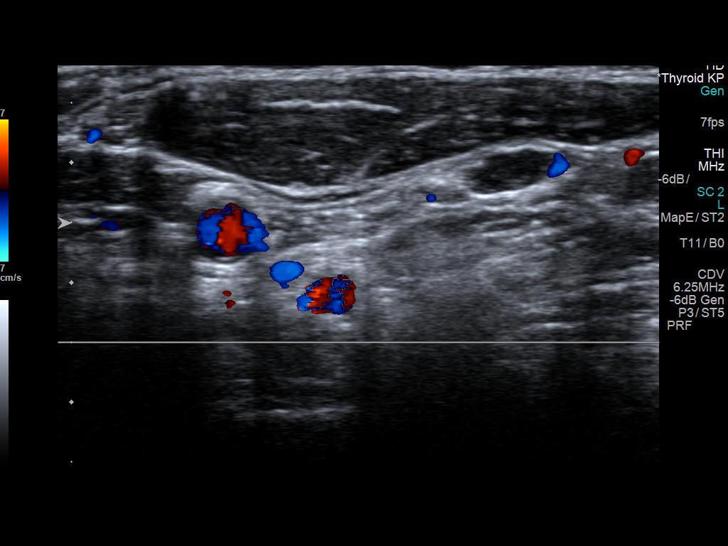

[13 of 25 positions shown; findings below may reference images not displayed]

FINDINGS: Parenchymal Echotexture: Moderately heterogenous

Isthmus: Diminutive in size measuring 0.1 cm in diameter

Right lobe: Diminutive in size measuring 5.1 x 0.8 x 1.4 cm

Left lobe: Diminutive in size measuring 3.9 x 0.8 x 1.2 cm

_________________________________________________________

Estimated total number of nodules >/= 1 cm: 1

Number of spongiform nodules >/=  2 cm not described below (TR1): 0

Number of mixed cystic and solid nodules >/= 1.5 cm not described
below (TR2): 0

_________________________________________________________

Nodule # 1:

Location: Left; Mid

Maximum size: 1.3 cm; Other 2 dimensions: 0.9 x 0.6 cm

Composition: cystic/almost completely cystic (0)

Echogenicity: anechoic (0)

Shape: not taller-than-wide (0)

Margins: smooth (0)

Echogenic foci: none (0)

ACR TI-RADS total points: 0.

ACR TI-RADS risk category: TR1 (0-1 points).

ACR TI-RADS recommendations:

This nodule does NOT meet TI-RADS criteria for biopsy or dedicated
follow-up.

_________________________________________________________

Benign appearing non pathologically enlarged cervical lymph nodes
are seen bilaterally.

No sonographic correlate for patient's palpable area of concern
involving the submandibular region of the left side of the neck.
IMPRESSION: 1. Moderately heterogeneous and atrophic appearing thyroid gland
compatible with provided history of prior S-RKR radioactive therapy.
2. Solitary approximately 1.3 cm cyst within the left lobe of the
thyroid does not meet imaging criteria to recommend percutaneous
sampling or dedicated follow-up.
3. No sonographic correlate for patient's palpable area of concern
involving the submandibular region of the left side of the neck.
The above is in keeping with the ACR TI-RADS recommendations - [HOSPITAL] 8066;[DATE].

## 2017-09-21 DIAGNOSIS — C44519 Basal cell carcinoma of skin of other part of trunk: Secondary | ICD-10-CM | POA: Diagnosis not present

## 2017-09-21 DIAGNOSIS — D485 Neoplasm of uncertain behavior of skin: Secondary | ICD-10-CM | POA: Diagnosis not present

## 2017-10-05 ENCOUNTER — Other Ambulatory Visit: Payer: Self-pay | Admitting: Allergy and Immunology

## 2017-10-06 DIAGNOSIS — D509 Iron deficiency anemia, unspecified: Secondary | ICD-10-CM | POA: Diagnosis not present

## 2017-10-07 DIAGNOSIS — C44511 Basal cell carcinoma of skin of breast: Secondary | ICD-10-CM | POA: Diagnosis not present

## 2017-10-10 ENCOUNTER — Other Ambulatory Visit: Payer: Self-pay | Admitting: Allergy and Immunology

## 2017-10-10 DIAGNOSIS — Z1231 Encounter for screening mammogram for malignant neoplasm of breast: Secondary | ICD-10-CM | POA: Diagnosis not present

## 2017-10-10 DIAGNOSIS — Z01419 Encounter for gynecological examination (general) (routine) without abnormal findings: Secondary | ICD-10-CM | POA: Diagnosis not present

## 2017-10-11 DIAGNOSIS — R194 Change in bowel habit: Secondary | ICD-10-CM | POA: Diagnosis not present

## 2017-10-11 DIAGNOSIS — K219 Gastro-esophageal reflux disease without esophagitis: Secondary | ICD-10-CM | POA: Diagnosis not present

## 2017-10-11 DIAGNOSIS — R140 Abdominal distension (gaseous): Secondary | ICD-10-CM | POA: Diagnosis not present

## 2017-10-13 ENCOUNTER — Telehealth: Payer: Self-pay | Admitting: Allergy and Immunology

## 2017-10-13 ENCOUNTER — Other Ambulatory Visit: Payer: Self-pay | Admitting: *Deleted

## 2017-10-13 DIAGNOSIS — R631 Polydipsia: Secondary | ICD-10-CM | POA: Diagnosis not present

## 2017-10-13 DIAGNOSIS — R5383 Other fatigue: Secondary | ICD-10-CM | POA: Diagnosis not present

## 2017-10-13 DIAGNOSIS — E049 Nontoxic goiter, unspecified: Secondary | ICD-10-CM | POA: Diagnosis not present

## 2017-10-13 DIAGNOSIS — R079 Chest pain, unspecified: Secondary | ICD-10-CM | POA: Diagnosis not present

## 2017-10-13 DIAGNOSIS — G4700 Insomnia, unspecified: Secondary | ICD-10-CM | POA: Diagnosis not present

## 2017-10-13 DIAGNOSIS — R944 Abnormal results of kidney function studies: Secondary | ICD-10-CM | POA: Diagnosis not present

## 2017-10-13 DIAGNOSIS — Z0001 Encounter for general adult medical examination with abnormal findings: Secondary | ICD-10-CM | POA: Diagnosis not present

## 2017-10-13 DIAGNOSIS — E039 Hypothyroidism, unspecified: Secondary | ICD-10-CM | POA: Diagnosis not present

## 2017-10-13 DIAGNOSIS — H04129 Dry eye syndrome of unspecified lacrimal gland: Secondary | ICD-10-CM | POA: Diagnosis not present

## 2017-10-13 MED ORDER — MONTELUKAST SODIUM 10 MG PO TABS: ORAL_TABLET | ORAL | 0 refills | Status: DC

## 2017-10-13 NOTE — Telephone Encounter (Signed)
Patient made an appointment for August 20 and she needs a courtesy refill for her Singulair. [facility].

## 2017-10-13 NOTE — Telephone Encounter (Signed)
A courtesy refill has been sent in.  

## 2017-10-14 DIAGNOSIS — L309 Dermatitis, unspecified: Secondary | ICD-10-CM | POA: Diagnosis not present

## 2017-10-19 ENCOUNTER — Other Ambulatory Visit: Payer: Self-pay

## 2017-10-19 ENCOUNTER — Encounter (HOSPITAL_BASED_OUTPATIENT_CLINIC_OR_DEPARTMENT_OTHER): Payer: Self-pay | Admitting: *Deleted

## 2017-10-21 ENCOUNTER — Ambulatory Visit: Payer: Self-pay | Admitting: Plastic Surgery

## 2017-10-21 DIAGNOSIS — C4490 Unspecified malignant neoplasm of skin, unspecified: Secondary | ICD-10-CM

## 2017-10-21 NOTE — H&P (Signed)
Kristy Walter is an 47 y.o. female.   Chief Complaint: skin cancer HPI: The patient is a 47 y.o. wf here with her husband for evaluation of an area on her left breast.  She noticed a changing skin lesion for the past year.  A biopsy was done and showed a basal cell carcinoma.  She is planning on have some cosmetic surgery with Dr. Stephanie Coup in two weeks.  She does not want to wait too long before having the skin lesion excised.  She is otherwise in good health.  She has undergone surgery in the past without complications  The area is located on the left medical breast and is healing well from the biopsy.  It is 1.5 x 1.5 cm in size  Past Medical History:  Diagnosis Date  . Asthma    no inhaler  . Basal cell carcinoma (BCC) of female breast    left breast  . GERD (gastroesophageal reflux disease)   . Hypothyroidism   . Seasonal allergies   . Thyroid disease     Past Surgical History:  Procedure Laterality Date  . BILATERAL SALPINGECTOMY Bilateral 06/15/2012   Procedure: BILATERAL SALPINGECTOMY;  Surgeon: Lovenia Kim, MD;  Location: [facility];  Service: Gynecology;  Laterality: Bilateral;  BILATERAL SALPINGECTOMY  . BREAST ENHANCEMENT SURGERY    . CHOLECYSTECTOMY    . CLAVICLE SURGERY    . NASAL SINUS SURGERY    . ROBOTIC ASSISTED TOTAL HYSTERECTOMY N/A 06/15/2012   Procedure: ROBOTIC ASSISTED TOTAL HYSTERECTOMY;  Surgeon: Lovenia Kim, MD;  Location: [facility];  Service: Gynecology;  Laterality: N/A;  ROBOTIC ASSISTED TOTAL HYSTERECTOMY  . Small tummy tuck     July 22nd 2019 at [facility]  . TONSILLECTOMY      Family History  Problem Relation Age of Onset  . Cancer Mother   . Cervical cancer Sister   . Allergic rhinitis Brother   . Allergic rhinitis Son    Social History:  reports that she has never smoked. She has never used smokeless tobacco. She reports that she drinks alcohol. She reports that she does not use drugs.  Allergies:  Allergies  Allergen Reactions  .  Sulfa Antibiotics Anaphylaxis    Pt states she can not breath  . Keflex [Cephalexin]   . Other Nausea And Vomiting    Bananas and Egg whites     (Not in a hospital admission)  No results found for this or any previous visit (from the past 48 hour(s)). No results found.  Review of Systems  Constitutional: Negative.   HENT: Negative.   Eyes: Negative.   Respiratory: Negative.   Cardiovascular: Negative.   Gastrointestinal: Negative.   Genitourinary: Negative.   Musculoskeletal: Negative.   Skin: Negative.   Neurological: Negative.   Psychiatric/Behavioral: Negative.     Last menstrual period 05/31/2012. Physical Exam  Constitutional: She appears well-developed and well-nourished.  HENT:  Head: Normocephalic and atraumatic.  Eyes: Pupils are equal, round, and reactive to light. EOM are normal.  Cardiovascular: Normal rate.  Respiratory: Effort normal.  GI: Soft.  Neurological: She is alert.  Skin: Skin is warm.  Psychiatric: She has a normal mood and affect. Her behavior is normal. Thought content normal.     Assessment/Plan Left breast skin cancer excision.  [place], DO 10/21/2017, 5:19 PM

## 2017-10-26 ENCOUNTER — Encounter (HOSPITAL_COMMUNITY): Payer: Self-pay | Admitting: Anesthesiology

## 2017-10-26 ENCOUNTER — Ambulatory Visit (HOSPITAL_BASED_OUTPATIENT_CLINIC_OR_DEPARTMENT_OTHER): Admission: RE | Admit: 2017-10-26 | Payer: 59 | Source: Ambulatory Visit | Admitting: Plastic Surgery

## 2017-10-26 DIAGNOSIS — J0110 Acute frontal sinusitis, unspecified: Secondary | ICD-10-CM | POA: Diagnosis not present

## 2017-10-26 HISTORY — DX: Basal cell carcinoma of skin of breast: C44.511

## 2017-10-26 HISTORY — DX: Hypothyroidism, unspecified: E03.9

## 2017-10-26 SURGERY — EXCISION, CARCINOMA, BASAL CELL, WITH FROZEN SECTION EXAMINATION
Anesthesia: Choice | Site: Breast | Laterality: Left

## 2017-10-26 MED ORDER — MIDAZOLAM HCL 2 MG/2ML IJ SOLN
INTRAMUSCULAR | Status: AC
  Filled 2017-10-26: qty 2

## 2017-10-31 ENCOUNTER — Ambulatory Visit: Payer: 59 | Admitting: Psychology

## 2017-11-02 ENCOUNTER — Encounter (HOSPITAL_BASED_OUTPATIENT_CLINIC_OR_DEPARTMENT_OTHER): Payer: Self-pay | Admitting: *Deleted

## 2017-11-02 ENCOUNTER — Ambulatory Visit: Payer: Self-pay | Admitting: Plastic Surgery

## 2017-11-02 DIAGNOSIS — C44511 Basal cell carcinoma of skin of breast: Secondary | ICD-10-CM

## 2017-11-02 NOTE — H&P (View-Only) (Signed)
Kristy Walter is an 47 y.o. female.   Chief Complaint: basal cell carcinoma HPI: The patient is a 47 y.o. yrs old wf here for treatment of an area on her left breast.  She noticed a changing skin lesion for the past year.  A biopsy was done and showed a basal cell carcinoma.  She is planning on have some cosmetic surgery with Dr. Stephanie Coup in two weeks.  She does not want to wait too long before having the skin lesion excised.  She is otherwise in good health.  She has undergone surgery in the past without complications  The area is located on the left medical breast and is healing well from the biopsy.  It is 1.5 x 1.5 cm in size.  Past Medical History:  Diagnosis Date  . Asthma    no inhaler  . Basal cell carcinoma (BCC) of female breast    left breast  . GERD (gastroesophageal reflux disease)   . Hypothyroidism   . Seasonal allergies   . Thyroid disease     Past Surgical History:  Procedure Laterality Date  . BILATERAL SALPINGECTOMY Bilateral 06/15/2012   Procedure: BILATERAL SALPINGECTOMY;  Surgeon: Lovenia Kim, MD;  Location: [facility];  Service: Gynecology;  Laterality: Bilateral;  BILATERAL SALPINGECTOMY  . BREAST ENHANCEMENT SURGERY    . CHOLECYSTECTOMY    . CLAVICLE SURGERY    . NASAL SINUS SURGERY    . ROBOTIC ASSISTED TOTAL HYSTERECTOMY N/A 06/15/2012   Procedure: ROBOTIC ASSISTED TOTAL HYSTERECTOMY;  Surgeon: Lovenia Kim, MD;  Location: [facility];  Service: Gynecology;  Laterality: N/A;  ROBOTIC ASSISTED TOTAL HYSTERECTOMY  . Small tummy tuck     July 22nd 2019 at [facility]  . TONSILLECTOMY      Family History  Problem Relation Age of Onset  . Cancer Mother   . Cervical cancer Sister   . Allergic rhinitis Brother   . Allergic rhinitis Son    Social History:  reports that she has never smoked. She has never used smokeless tobacco. She reports that she drinks alcohol. She reports that she does not use drugs.  Allergies:  Allergies  Allergen Reactions  .  Sulfa Antibiotics Anaphylaxis    Pt states she can not breath  . Keflex [Cephalexin]   . Other Nausea And Vomiting    Bananas and Egg whites     (Not in a hospital admission)  No results found for this or any previous visit (from the past 48 hour(s)). No results found.  Review of Systems  Constitutional: Negative.   HENT: Negative.   Eyes: Negative.   Respiratory: Negative.   Cardiovascular: Negative.   Gastrointestinal: Negative.   Genitourinary: Negative.   Musculoskeletal: Negative.   Skin: Negative.   Neurological: Negative.   Psychiatric/Behavioral: Negative.     Last menstrual period 05/31/2012. Physical Exam  Constitutional: She is oriented to person, place, and time. She appears well-developed and well-nourished.  HENT:  Head: Normocephalic and atraumatic.  Eyes: Pupils are equal, round, and reactive to light. EOM are normal.  Cardiovascular: Normal rate.  Respiratory: Effort normal.  GI: Soft. She exhibits no distension. There is no tenderness.  Neurological: She is alert and oriented to person, place, and time.  Skin: Skin is warm.  Psychiatric: She has a normal mood and affect. Her behavior is normal. Judgment and thought content normal.     Assessment/Plan Plan for excision of basal cell carcinoma of chest.  [place], DO 11/02/2017,  7:23 AM

## 2017-11-02 NOTE — H&P (Signed)
Kristy Walter is an 47 y.o. female.   Chief Complaint: basal cell carcinoma HPI: The patient is a 47 y.o. yrs old wf here for treatment of an area on her left breast.  She noticed a changing skin lesion for the past year.  A biopsy was done and showed a basal cell carcinoma.  She is planning on have some cosmetic surgery with Dr. Stephanie Coup in two weeks.  She does not want to wait too long before having the skin lesion excised.  She is otherwise in good health.  She has undergone surgery in the past without complications  The area is located on the left medical breast and is healing well from the biopsy.  It is 1.5 x 1.5 cm in size.  Past Medical History:  Diagnosis Date  . Asthma    no inhaler  . Basal cell carcinoma (BCC) of female breast    left breast  . GERD (gastroesophageal reflux disease)   . Hypothyroidism   . Seasonal allergies   . Thyroid disease     Past Surgical History:  Procedure Laterality Date  . BILATERAL SALPINGECTOMY Bilateral 06/15/2012   Procedure: BILATERAL SALPINGECTOMY;  Surgeon: Lovenia Kim, MD;  Location: [facility];  Service: Gynecology;  Laterality: Bilateral;  BILATERAL SALPINGECTOMY  . BREAST ENHANCEMENT SURGERY    . CHOLECYSTECTOMY    . CLAVICLE SURGERY    . NASAL SINUS SURGERY    . ROBOTIC ASSISTED TOTAL HYSTERECTOMY N/A 06/15/2012   Procedure: ROBOTIC ASSISTED TOTAL HYSTERECTOMY;  Surgeon: Lovenia Kim, MD;  Location: [facility];  Service: Gynecology;  Laterality: N/A;  ROBOTIC ASSISTED TOTAL HYSTERECTOMY  . Small tummy tuck     July 22nd 2019 at [facility]  . TONSILLECTOMY      Family History  Problem Relation Age of Onset  . Cancer Mother   . Cervical cancer Sister   . Allergic rhinitis Brother   . Allergic rhinitis Son    Social History:  reports that she has never smoked. She has never used smokeless tobacco. She reports that she drinks alcohol. She reports that she does not use drugs.  Allergies:  Allergies  Allergen Reactions  .  Sulfa Antibiotics Anaphylaxis    Pt states she can not breath  . Keflex [Cephalexin]   . Other Nausea And Vomiting    Bananas and Egg whites     (Not in a hospital admission)  No results found for this or any previous visit (from the past 48 hour(s)). No results found.  Review of Systems  Constitutional: Negative.   HENT: Negative.   Eyes: Negative.   Respiratory: Negative.   Cardiovascular: Negative.   Gastrointestinal: Negative.   Genitourinary: Negative.   Musculoskeletal: Negative.   Skin: Negative.   Neurological: Negative.   Psychiatric/Behavioral: Negative.     Last menstrual period 05/31/2012. Physical Exam  Constitutional: She is oriented to person, place, and time. She appears well-developed and well-nourished.  HENT:  Head: Normocephalic and atraumatic.  Eyes: Pupils are equal, round, and reactive to light. EOM are normal.  Cardiovascular: Normal rate.  Respiratory: Effort normal.  GI: Soft. She exhibits no distension. There is no tenderness.  Neurological: She is alert and oriented to person, place, and time.  Skin: Skin is warm.  Psychiatric: She has a normal mood and affect. Her behavior is normal. Judgment and thought content normal.     Assessment/Plan Plan for excision of basal cell carcinoma of chest.  [place], DO 11/02/2017,  7:23 AM

## 2017-11-03 ENCOUNTER — Other Ambulatory Visit: Payer: Self-pay

## 2017-11-03 ENCOUNTER — Ambulatory Visit (HOSPITAL_BASED_OUTPATIENT_CLINIC_OR_DEPARTMENT_OTHER)
Admission: RE | Admit: 2017-11-03 | Discharge: 2017-11-03 | Disposition: A | Discharge status: Home / Self Care | Payer: 59 | Source: Ambulatory Visit | Attending: Plastic Surgery | Admitting: Plastic Surgery

## 2017-11-03 ENCOUNTER — Ambulatory Visit (HOSPITAL_BASED_OUTPATIENT_CLINIC_OR_DEPARTMENT_OTHER): Payer: 59 | Admitting: Anesthesiology

## 2017-11-03 ENCOUNTER — Encounter (HOSPITAL_BASED_OUTPATIENT_CLINIC_OR_DEPARTMENT_OTHER): Payer: Self-pay

## 2017-11-03 ENCOUNTER — Encounter (HOSPITAL_BASED_OUTPATIENT_CLINIC_OR_DEPARTMENT_OTHER)
Admission: RE | Disposition: A | Discharge status: Home / Self Care | Payer: Self-pay | Source: Ambulatory Visit | Attending: Plastic Surgery

## 2017-11-03 DIAGNOSIS — C44511 Basal cell carcinoma of skin of breast: Secondary | ICD-10-CM | POA: Insufficient documentation

## 2017-11-03 DIAGNOSIS — C44501 Unspecified malignant neoplasm of skin of breast: Secondary | ICD-10-CM | POA: Diagnosis not present

## 2017-11-03 DIAGNOSIS — K219 Gastro-esophageal reflux disease without esophagitis: Secondary | ICD-10-CM | POA: Diagnosis not present

## 2017-11-03 DIAGNOSIS — J45909 Unspecified asthma, uncomplicated: Secondary | ICD-10-CM | POA: Diagnosis not present

## 2017-11-03 DIAGNOSIS — C4490 Unspecified malignant neoplasm of skin, unspecified: Secondary | ICD-10-CM

## 2017-11-03 DIAGNOSIS — E039 Hypothyroidism, unspecified: Secondary | ICD-10-CM | POA: Diagnosis not present

## 2017-11-03 HISTORY — PX: MASS EXCISION: SHX2000

## 2017-11-03 SURGERY — EXCISION MASS
Anesthesia: Monitor Anesthesia Care | Site: Breast | Laterality: Left

## 2017-11-03 MED ORDER — CIPROFLOXACIN IN D5W 400 MG/200ML IV SOLN
400.0000 mg | INTRAVENOUS | Status: AC
  Administered 2017-11-03: 400 mg via INTRAVENOUS

## 2017-11-03 MED ORDER — PROPOFOL 500 MG/50ML IV EMUL
INTRAVENOUS | Status: DC | PRN
  Administered 2017-11-03: 75 ug/kg/min via INTRAVENOUS

## 2017-11-03 MED ORDER — ONDANSETRON HCL 4 MG/2ML IJ SOLN
INTRAMUSCULAR | Status: AC
  Filled 2017-11-03: qty 2

## 2017-11-03 MED ORDER — ACETAMINOPHEN 160 MG/5ML PO SOLN: 325.0000 mg | ORAL | Status: DC | PRN

## 2017-11-03 MED ORDER — BACITRACIN ZINC 500 UNIT/GM EX OINT
TOPICAL_OINTMENT | CUTANEOUS | Status: AC
  Filled 2017-11-03: qty 0.9

## 2017-11-03 MED ORDER — SODIUM CHLORIDE 0.9% FLUSH: 3.0000 mL | INTRAVENOUS | Status: DC | PRN

## 2017-11-03 MED ORDER — OXYCODONE HCL 5 MG PO TABS
5.0000 mg | ORAL_TABLET | Freq: Once | ORAL | Status: DC | PRN
Start: 2017-11-03 — End: 2017-11-03

## 2017-11-03 MED ORDER — CIPROFLOXACIN IN D5W 400 MG/200ML IV SOLN
INTRAVENOUS | Status: AC
  Filled 2017-11-03: qty 200

## 2017-11-03 MED ORDER — OXYCODONE HCL 5 MG/5ML PO SOLN: 5.0000 mg | Freq: Once | ORAL | Status: DC | PRN

## 2017-11-03 MED ORDER — ONDANSETRON HCL 4 MG/2ML IJ SOLN
INTRAMUSCULAR | Status: DC | PRN
  Administered 2017-11-03: 4 mg via INTRAVENOUS

## 2017-11-03 MED ORDER — FENTANYL CITRATE (PF) 100 MCG/2ML IJ SOLN
INTRAMUSCULAR | Status: AC
  Filled 2017-11-03: qty 2

## 2017-11-03 MED ORDER — CIPROFLOXACIN IN D5W 400 MG/200ML IV SOLN: 400.0000 mg | INTRAVENOUS | Status: DC

## 2017-11-03 MED ORDER — DEXAMETHASONE SODIUM PHOSPHATE 10 MG/ML IJ SOLN
INTRAMUSCULAR | Status: AC
  Filled 2017-11-03: qty 1

## 2017-11-03 MED ORDER — BUPIVACAINE-EPINEPHRINE (PF) 0.25% -1:200000 IJ SOLN
INTRAMUSCULAR | Status: AC
  Filled 2017-11-03: qty 30

## 2017-11-03 MED ORDER — FENTANYL CITRATE (PF) 100 MCG/2ML IJ SOLN
50.0000 ug | INTRAMUSCULAR | Status: DC | PRN
  Administered 2017-11-03: 100 ug via INTRAVENOUS

## 2017-11-03 MED ORDER — ACETAMINOPHEN 325 MG PO TABS: 325.0000 mg | ORAL_TABLET | ORAL | Status: DC | PRN

## 2017-11-03 MED ORDER — SCOPOLAMINE 1 MG/3DAYS TD PT72: 1.0000 | MEDICATED_PATCH | Freq: Once | TRANSDERMAL | Status: DC | PRN

## 2017-11-03 MED ORDER — LACTATED RINGERS IV SOLN
INTRAVENOUS | Status: DC
  Administered 2017-11-03: 07:00:00 via INTRAVENOUS

## 2017-11-03 MED ORDER — BUPIVACAINE-EPINEPHRINE 0.25% -1:200000 IJ SOLN
INTRAMUSCULAR | Status: DC | PRN
  Administered 2017-11-03: 7 mL

## 2017-11-03 MED ORDER — SODIUM CHLORIDE 0.9 % IV SOLN: 250.0000 mL | INTRAVENOUS | Status: DC | PRN

## 2017-11-03 MED ORDER — MIDAZOLAM HCL 2 MG/2ML IJ SOLN
INTRAMUSCULAR | Status: AC
  Filled 2017-11-03: qty 2

## 2017-11-03 MED ORDER — LIDOCAINE-EPINEPHRINE 1 %-1:100000 IJ SOLN
INTRAMUSCULAR | Status: AC
  Filled 2017-11-03: qty 1

## 2017-11-03 MED ORDER — PROPOFOL 10 MG/ML IV BOLUS
INTRAVENOUS | Status: DC | PRN
  Administered 2017-11-03: 20 mg via INTRAVENOUS
  Administered 2017-11-03: 30 mg via INTRAVENOUS

## 2017-11-03 MED ORDER — MIDAZOLAM HCL 2 MG/2ML IJ SOLN
1.0000 mg | INTRAMUSCULAR | Status: DC | PRN
  Administered 2017-11-03: 2 mg via INTRAVENOUS

## 2017-11-03 MED ORDER — PROPOFOL 500 MG/50ML IV EMUL
INTRAVENOUS | Status: AC
  Filled 2017-11-03: qty 50

## 2017-11-03 MED ORDER — SODIUM CHLORIDE 0.9% FLUSH: 3.0000 mL | Freq: Two times a day (BID) | INTRAVENOUS | Status: DC

## 2017-11-03 MED ORDER — FENTANYL CITRATE (PF) 100 MCG/2ML IJ SOLN: 25.0000 ug | INTRAMUSCULAR | Status: DC | PRN

## 2017-11-03 MED ORDER — OXYCODONE HCL 5 MG PO TABS: 5.0000 mg | ORAL_TABLET | ORAL | Status: DC | PRN

## 2017-11-03 SURGICAL SUPPLY — 67 items
ADH SKN CLS APL DERMABOND .7 (GAUZE/BANDAGES/DRESSINGS) ×1
APL SKNCLS STERI-STRIP NONHPOA (GAUZE/BANDAGES/DRESSINGS)
BENZOIN TINCTURE PRP APPL 2/3 (GAUZE/BANDAGES/DRESSINGS) IMPLANT
BLADE CLIPPER SURG (BLADE) IMPLANT
BLADE SURG 15 STRL LF DISP TIS (BLADE) ×1 IMPLANT
BLADE SURG 15 STRL SS (BLADE) ×2
BNDG CONFORM 2 STRL LF (GAUZE/BANDAGES/DRESSINGS) IMPLANT
BNDG ELASTIC 2X5.8 VLCR STR LF (GAUZE/BANDAGES/DRESSINGS) IMPLANT
CANISTER SUCT 1200ML W/VALVE (MISCELLANEOUS) IMPLANT
CHLORAPREP W/TINT 26ML (MISCELLANEOUS) ×1 IMPLANT
CLEANER CAUTERY TIP 5X5 PAD (MISCELLANEOUS) IMPLANT
CLSR STERI-STRIP ANTIMIC 1/2X4 (GAUZE/BANDAGES/DRESSINGS) ×1 IMPLANT
CORD BIPOLAR FORCEPS 12FT (ELECTRODE) IMPLANT
COVER BACK TABLE 60X90IN (DRAPES) ×2 IMPLANT
COVER MAYO STAND STRL (DRAPES) ×2 IMPLANT
DECANTER SPIKE VIAL GLASS SM (MISCELLANEOUS) IMPLANT
DERMABOND ADVANCED (GAUZE/BANDAGES/DRESSINGS) ×1
DERMABOND ADVANCED .7 DNX12 (GAUZE/BANDAGES/DRESSINGS) IMPLANT
DRAPE LAPAROTOMY 100X72 PEDS (DRAPES) ×1 IMPLANT
DRAPE U-SHAPE 76X120 STRL (DRAPES) ×1 IMPLANT
DRSG TEGADERM 2-3/8X2-3/4 SM (GAUZE/BANDAGES/DRESSINGS) IMPLANT
DRSG TEGADERM 4X4.75 (GAUZE/BANDAGES/DRESSINGS) IMPLANT
ELECT COATED BLADE 2.86 ST (ELECTRODE) ×1 IMPLANT
ELECT NDL BLADE 2-5/6 (NEEDLE) IMPLANT
ELECT NEEDLE BLADE 2-5/6 (NEEDLE) IMPLANT
ELECT REM PT RETURN 9FT ADLT (ELECTROSURGICAL) ×2
ELECT REM PT RETURN 9FT PED (ELECTROSURGICAL)
ELECTRODE REM PT RETRN 9FT PED (ELECTROSURGICAL) IMPLANT
ELECTRODE REM PT RTRN 9FT ADLT (ELECTROSURGICAL) IMPLANT
GAUZE SPONGE 4X4 12PLY STRL LF (GAUZE/BANDAGES/DRESSINGS) IMPLANT
GLOVE BIO SURGEON STRL SZ 6.5 (GLOVE) ×4 IMPLANT
GLOVE BIO SURGEON STRL SZ7 (GLOVE) ×1 IMPLANT
GOWN STRL REUS W/ TWL LRG LVL3 (GOWN DISPOSABLE) ×2 IMPLANT
GOWN STRL REUS W/TWL LRG LVL3 (GOWN DISPOSABLE) ×4
NDL HYPO 30GX1 BEV (NEEDLE) IMPLANT
NDL PRECISIONGLIDE 27X1.5 (NEEDLE) ×1 IMPLANT
NEEDLE HYPO 30GX1 BEV (NEEDLE) IMPLANT
NEEDLE PRECISIONGLIDE 27X1.5 (NEEDLE) ×2 IMPLANT
NS IRRIG 1000ML POUR BTL (IV SOLUTION) IMPLANT
PACK BASIN DAY SURGERY FS (CUSTOM PROCEDURE TRAY) ×2 IMPLANT
PAD CLEANER CAUTERY TIP 5X5 (MISCELLANEOUS)
PENCIL BUTTON HOLSTER BLD 10FT (ELECTRODE) ×1 IMPLANT
RUBBERBAND STERILE (MISCELLANEOUS) IMPLANT
SHEET MEDIUM DRAPE 40X70 STRL (DRAPES) IMPLANT
SLEEVE SCD COMPRESS KNEE MED (MISCELLANEOUS) ×1 IMPLANT
SPONGE GAUZE 2X2 8PLY STRL LF (GAUZE/BANDAGES/DRESSINGS) IMPLANT
STRIP CLOSURE SKIN 1/2X4 (GAUZE/BANDAGES/DRESSINGS) IMPLANT
SUCTION FRAZIER HANDLE 10FR (MISCELLANEOUS)
SUCTION TUBE FRAZIER 10FR DISP (MISCELLANEOUS) IMPLANT
SUT MNCRL 6-0 UNDY P1 1X18 (SUTURE) IMPLANT
SUT MNCRL AB 3-0 PS2 18 (SUTURE) IMPLANT
SUT MNCRL AB 4-0 PS2 18 (SUTURE) ×1 IMPLANT
SUT MON AB 5-0 P3 18 (SUTURE) IMPLANT
SUT MON AB 5-0 PS2 18 (SUTURE) ×1 IMPLANT
SUT MONOCRYL 6-0 P1 1X18 (SUTURE)
SUT PROLENE 5 0 P 3 (SUTURE) IMPLANT
SUT PROLENE 5 0 PS 2 (SUTURE) IMPLANT
SUT PROLENE 6 0 P 1 18 (SUTURE) IMPLANT
SUT VIC AB 5-0 P-3 18X BRD (SUTURE) IMPLANT
SUT VIC AB 5-0 P3 18 (SUTURE)
SUT VIC AB 5-0 PS2 18 (SUTURE) IMPLANT
SUT VICRYL 4-0 PS2 18IN ABS (SUTURE) IMPLANT
SYR BULB 3OZ (MISCELLANEOUS) IMPLANT
SYR CONTROL 10ML LL (SYRINGE) ×2 IMPLANT
TOWEL GREEN STERILE FF (TOWEL DISPOSABLE) ×2 IMPLANT
TRAY DSU PREP LF (CUSTOM PROCEDURE TRAY) ×1 IMPLANT
TUBE CONNECTING 20X1/4 (TUBING) IMPLANT

## 2017-11-03 NOTE — Op Note (Signed)
DATE OF OPERATION: 11/03/2017  LOCATION: [facility]  PREOPERATIVE DIAGNOSIS: left breast skin cancer  POSTOPERATIVE DIAGNOSIS: Same  PROCEDURE: Excision of left breast skin cancer 2 x 4 cm  SURGEON: Lydian Chavous [place] Daylani Deblois, DO  EBL: none  CONDITION: Stable  COMPLICATIONS: None  INDICATION: The patient, Kristy Walter, is a 47 y.o. female born on June 13, 1970, is here for treatment of a left breast skin cancer that has been increasing in size.   PROCEDURE DETAILS:  The patient was seen prior to surgery and marked.  The IV antibiotics were given. The patient was taken to the operating room and given a general anesthetic. A standard time out was performed and all information was confirmed by those in the room. SCDs were placed.   The area was prepped and draped.  Local was injected for intraoperative hemostasis.  The #15 blade was used to excise the 2 x 4 cm lesion with 3 mm margins.  Short stitch placed at 9 o'clock and long stitch at 12 o'clock.  The bovie was used to obtain hemostasis.  The deep layers were closed with the 4-0 Monocryl.  A running subcuticular suture was placed with the 4-0 Monocryl to close the skin.  Derma bond and steri strips were applied.  The patient was allowed to wake up and taken to recovery room in stable condition at the end of the case. The family was notified at the end of the case.

## 2017-11-03 NOTE — Discharge Instructions (Signed)
May shower tomorrow.  Continue the binder or sports bra. No heavy lifting.    Post Anesthesia Home Care Instructions  Activity: Get plenty of rest for the remainder of the day. A responsible individual must stay with you for 24 hours following the procedure.  For the next 24 hours, DO NOT: -Drive a car -Paediatric nurse -Drink alcoholic beverages -Take any medication unless instructed by your physician -Make any legal decisions or sign important papers.  Meals: Start with liquid foods such as gelatin or soup. Progress to regular foods as tolerated. Avoid greasy, spicy, heavy foods. If nausea and/or vomiting occur, drink only clear liquids until the nausea and/or vomiting subsides. Call your physician if vomiting continues.  Special Instructions/Symptoms: Your throat may feel dry or sore from the anesthesia or the breathing tube placed in your throat during surgery. If this causes discomfort, gargle with warm salt water. The discomfort should disappear within 24 hours.  If you had a scopolamine patch placed behind your ear for the management of post- operative nausea and/or vomiting:  1. The medication in the patch is effective for 72 hours, after which it should be removed.  Wrap patch in a tissue and discard in the trash. Wash hands thoroughly with soap and water. 2. You may remove the patch earlier than 72 hours if you experience unpleasant side effects which may include dry mouth, dizziness or visual disturbances. 3. Avoid touching the patch. Wash your hands with soap and water after contact with the patch.

## 2017-11-03 NOTE — Interval H&P Note (Signed)
History and Physical Interval Note:  11/03/2017 7:23 AM  Kristy Walter  has presented today for surgery, with the diagnosis of Basal cell carcinoma of skin of left breast  The various methods of treatment have been discussed with the patient and family. After consideration of risks, benefits and other options for treatment, the patient has consented to  Procedure(s): EXCISION OF LEFT BREAST SKIN CANCER (Left) as a surgical intervention .  The patient's history has been reviewed, patient examined, no change in status, stable for surgery.  I have reviewed the patient's chart and labs.  Questions were answered to the patient's satisfaction.     Loel Lofty Dilling[place]

## 2017-11-03 NOTE — Transfer of Care (Signed)
Immediate Anesthesia Transfer of Care Note  Patient: Kristy Walter  Procedure(s) Performed: EXCISION OF LEFT BREAST SKIN CANCER (Left Breast)  Patient Location: PACU  Anesthesia Type:MAC  Level of Consciousness: awake, alert  and oriented  Airway & Oxygen Therapy: Patient Spontanous Breathing and Patient connected to face mask oxygen  Post-op Assessment: Report given to RN and Post -op Vital signs reviewed and stable  Post vital signs: Reviewed and stable  Last Vitals:  Vitals Value Taken Time  BP    Temp    Pulse 80 11/03/2017  8:00 AM  Resp    SpO2 93 % 11/03/2017  8:00 AM  Vitals shown include unvalidated device data.  Last Pain:  Vitals:   11/03/17 0647  TempSrc: Oral  PainSc: 0-No pain         Complications: No apparent anesthesia complications

## 2017-11-03 NOTE — Anesthesia Preprocedure Evaluation (Signed)
Anesthesia Evaluation  Patient identified by MRN, date of birth, ID band Patient awake    Reviewed: Allergy & Precautions, NPO status , Patient's Chart, lab work & pertinent test results  History of Anesthesia Complications Negative for: history of anesthetic complications  Airway Mallampati: II  TM Distance: >3 FB Neck ROM: Full    Dental  (+) Teeth Intact   Pulmonary asthma ,    breath sounds clear to auscultation       Cardiovascular negative cardio ROS   Rhythm:Regular     Neuro/Psych negative neurological ROS  negative psych ROS   GI/Hepatic Neg liver ROS, GERD  Medicated and Controlled,  Endo/Other  Hypothyroidism   Renal/GU negative Renal ROS     Musculoskeletal negative musculoskeletal ROS (+)   Abdominal   Peds  Hematology negative hematology ROS (+)   Anesthesia Other Findings   Reproductive/Obstetrics                             Anesthesia Physical Anesthesia Plan  ASA: II  Anesthesia Plan: MAC   Post-op Pain Management:    Induction:   PONV Risk Score and Plan: 2 and Ondansetron and Treatment may vary due to age or medical condition  Airway Management Planned: Nasal Cannula  Additional Equipment:   Intra-op Plan:   Post-operative Plan:   Informed Consent: I have reviewed the patients History and Physical, chart, labs and discussed the procedure including the risks, benefits and alternatives for the proposed anesthesia with the patient or authorized representative who has indicated his/her understanding and acceptance.   Dental advisory given  Plan Discussed with: CRNA and Surgeon  Anesthesia Plan Comments:         Anesthesia Quick Evaluation

## 2017-11-04 ENCOUNTER — Encounter (HOSPITAL_BASED_OUTPATIENT_CLINIC_OR_DEPARTMENT_OTHER): Payer: Self-pay | Admitting: Plastic Surgery

## 2017-11-04 NOTE — Anesthesia Postprocedure Evaluation (Signed)
Anesthesia Post Note  Patient: Kristy Walter  Procedure(s) Performed: EXCISION OF LEFT BREAST SKIN CANCER (Left Breast)     Patient location during evaluation: PACU Anesthesia Type: MAC Level of consciousness: awake and alert Pain management: pain level controlled Vital Signs Assessment: post-procedure vital signs reviewed and stable Respiratory status: spontaneous breathing, nonlabored ventilation, respiratory function stable and patient connected to nasal cannula oxygen Cardiovascular status: stable and blood pressure returned to baseline Postop Assessment: no apparent nausea or vomiting Anesthetic complications: no    Last Vitals:  Vitals:   11/03/17 0830 11/03/17 0852  BP: 99/65 105/72  Pulse: (!) 58 60  Resp: 16 18  Temp:  36.7 C  SpO2: 96% 98%    Last Pain:  Vitals:   11/03/17 0852  TempSrc: Oral  PainSc: 0-No pain                 Marquerite Forsman

## 2017-11-08 ENCOUNTER — Other Ambulatory Visit: Payer: Self-pay | Admitting: Allergy and Immunology

## 2017-11-14 DIAGNOSIS — Z9889 Other specified postprocedural states: Secondary | ICD-10-CM | POA: Diagnosis not present

## 2017-11-14 DIAGNOSIS — S21102D Unspecified open wound of left front wall of thorax without penetration into thoracic cavity, subsequent encounter: Secondary | ICD-10-CM | POA: Diagnosis not present

## 2017-11-15 ENCOUNTER — Ambulatory Visit (INDEPENDENT_AMBULATORY_CARE_PROVIDER_SITE_OTHER): Payer: 59 | Admitting: Allergy and Immunology

## 2017-11-15 ENCOUNTER — Encounter: Payer: Self-pay | Admitting: Allergy and Immunology

## 2017-11-15 VITALS — BP 106/66 | HR 92 | Resp 16 | Ht 67.5 in | Wt 180.6 lb

## 2017-11-15 DIAGNOSIS — J4520 Mild intermittent asthma, uncomplicated: Secondary | ICD-10-CM

## 2017-11-15 DIAGNOSIS — H1010 Acute atopic conjunctivitis, unspecified eye: Secondary | ICD-10-CM | POA: Diagnosis not present

## 2017-11-15 DIAGNOSIS — K219 Gastro-esophageal reflux disease without esophagitis: Secondary | ICD-10-CM | POA: Diagnosis not present

## 2017-11-15 DIAGNOSIS — J309 Allergic rhinitis, unspecified: Secondary | ICD-10-CM

## 2017-11-15 MED ORDER — ALBUTEROL SULFATE HFA 108 (90 BASE) MCG/ACT IN AERS: INHALATION_SPRAY | RESPIRATORY_TRACT | 1 refills | Status: DC

## 2017-11-15 MED ORDER — MONTELUKAST SODIUM 10 MG PO TABS: ORAL_TABLET | ORAL | 3 refills | Status: DC

## 2017-11-15 MED ORDER — DEXLANSOPRAZOLE 60 MG PO CPDR: DELAYED_RELEASE_CAPSULE | ORAL | 3 refills | Status: DC

## 2017-11-15 MED ORDER — BEPOTASTINE BESILATE 1.5 % OP SOLN: 1.0000 [drp] | Freq: Every day | OPHTHALMIC | 5 refills | Status: DC

## 2017-11-15 NOTE — Patient Instructions (Addendum)
    1. Allergen avoidance measures - dust mite and pollens  2. Treat inflammation:   A. montelukast 10 mg daily  B. OTC Nasacort one spray each nostril one time per day.    3. Treat reflux:   A. Slowly eliminate caffeine  B. Dexilant 60 mg 1 time a day  4. If needed:   A. OTC antihistamine  B. nasal saline wash  C. ProAir HFA 2 puffs every 4-6 hours  D. Bepreve 1 drop each eye twice a day.    5.  Consider a course of immunotherapy  6. Obtain fall flu vaccine  7.  Return to clinic in 1 year or earlier if problem

## 2017-11-15 NOTE — Progress Notes (Signed)
Follow-up Note  Referring Provider: Glendale Chard, MD Primary Provider: Glendale Chard, MD Date of Office Visit: 11/15/2017  Subjective:   Kristy Walter (DOB: [birth year]) is a 47 y.o. female who returns to the [facility] on 11/15/2017 in re-evaluation of the following:  HPI: Kristy Walter presents to this clinic in evaluation of allergic rhinoconjunctivitis and intermittent asthma and LPR.  Her last visit to this clinic was 06 July 2016.  While consistently using Zyrtec and Singulair she has had recurrent problems with her upper airways.  She informs me that she has had 3 antibiotic administrations for what sounds like head colds.  She will develop head congestion and sneezing and fullness in her head and a little bit of sore throat and within 4 days of these symptoms she is treated with an antibiotic.  There is an associated cough with these episodes but no wheezing or shortness of breath or chest tightness and no need to use a short acting bronchodilator.  She has had very significant reflux and at this point time she is not treating reflux with a PPI because of the risk of dementia developing with the use of a proton pump inhibitor.  She still continues to drink coffee in the morning.  On occasion she will have some wine.  Her asthma has been relatively nonexistent and she can exercise and rarely uses a short acting bronchodilator and has not required a systemic steroid to treat an exacerbation.  Allergies as of 11/15/2017      Reactions   Sulfa Antibiotics Anaphylaxis   Pt states she can not breath   Keflex [cephalexin]    Latex Hives   Other Nausea And Vomiting   Bananas and Egg whites      Medication List      albuterol 108 (90 Base) MCG/ACT inhaler Commonly known as:  PROVENTIL HFA;VENTOLIN HFA Inhale two puffs every four to six hours as needed for cough or wheeze.   cetirizine 10 MG tablet Commonly known as:  ZYRTEC Take 10 mg by mouth daily.     dexlansoprazole 60 MG capsule Commonly known as:  DEXILANT TAKE ONE CAPSULE BY MOUTH TWICE DAILY AS DIRECTED.   levothyroxine 100 MCG tablet Commonly known as:  SYNTHROID, LEVOTHROID Take 100 mcg by mouth daily before breakfast.   montelukast 10 MG tablet Commonly known as:  SINGULAIR Take one tablet once daily as directed.       Past Medical History:  Diagnosis Date  . Asthma    no inhaler  . Basal cell carcinoma (BCC) of female breast    left breast  . GERD (gastroesophageal reflux disease)   . Hypothyroidism   . Seasonal allergies   . Thyroid disease     Past Surgical History:  Procedure Laterality Date  . BILATERAL SALPINGECTOMY Bilateral 06/15/2012   Procedure: BILATERAL SALPINGECTOMY;  Surgeon: Kristy Kim, MD;  Location: [facility];  Service: Gynecology;  Laterality: Bilateral;  BILATERAL SALPINGECTOMY  . BREAST ENHANCEMENT SURGERY    . CHOLECYSTECTOMY    . CLAVICLE SURGERY    . MASS EXCISION Left 11/03/2017   Procedure: EXCISION OF LEFT BREAST SKIN CANCER;  Surgeon: Kristy Going, DO;  Location: [facility];  Service: Plastics;  Laterality: Left;  . NASAL SINUS SURGERY    . ROBOTIC ASSISTED TOTAL HYSTERECTOMY N/A 06/15/2012   Procedure: ROBOTIC ASSISTED TOTAL HYSTERECTOMY;  Surgeon: Kristy Kim, MD;  Location: [facility];  Service: Gynecology;  Laterality: N/A;  ROBOTIC ASSISTED TOTAL HYSTERECTOMY  . Small tummy tuck     July 22nd 2019 at [facility]  . TONSILLECTOMY      Review of systems negative except as noted in HPI / PMHx or noted below:  Review of Systems  Constitutional: Negative.   HENT: Negative.   Eyes: Negative.   Respiratory: Negative.   Cardiovascular: Negative.   Gastrointestinal: Negative.   Genitourinary: Negative.   Musculoskeletal: Negative.   Skin: Negative.   Neurological: Negative.   Endo/Heme/Allergies: Negative.   Psychiatric/Behavioral: Negative.      Objective:   Vitals:   11/15/17 0849  BP:  106/66  Pulse: 92  Resp: 16   Height: 5' 7.5" (171.5 cm)  Weight: 180 lb 9.6 oz (81.9 kg)   Physical Exam  Constitutional:  Constant throat clearing  HENT:  Head: Normocephalic.  Right Ear: Tympanic membrane, external ear and ear canal normal.  Left Ear: Tympanic membrane, external ear and ear canal normal.  Nose: Nose normal. No mucosal edema or rhinorrhea.  Mouth/Throat: Uvula is midline, oropharynx is clear and moist and mucous membranes are normal. No oropharyngeal exudate.  Eyes: Conjunctivae are normal.  Neck: Trachea normal. No tracheal tenderness present. No tracheal deviation present. No thyromegaly present.  Cardiovascular: Normal rate, regular rhythm, S1 normal, S2 normal and normal heart sounds.  No murmur heard. Pulmonary/Chest: Breath sounds normal. No stridor. No respiratory distress. She has no wheezes. She has no rales.  Musculoskeletal: She exhibits no edema.  Lymphadenopathy:       Head (right side): No tonsillar adenopathy present.       Head (left side): No tonsillar adenopathy present.    She has no cervical adenopathy.  Neurological: She is alert.  Skin: No rash noted. She is not diaphoretic. No erythema. Nails show no clubbing.    Diagnostics:    Spirometry was performed and demonstrated an FEV1 of 3.13 at 95 % of predicted.  The patient had an Asthma Control Test with the following results: ACT Total Score: 22.    Assessment and Plan:   1. Asthma, mild intermittent, well-controlled   2. Allergic rhinoconjunctivitis   3. LPRD (laryngopharyngeal reflux disease)      1. Allergen avoidance measures - dust mite and pollens  2. Treat inflammation:   A. montelukast 10 mg daily  B. OTC Nasacort one spray each nostril one time per day.    3. Treat reflux:   A. Slowly eliminate caffeine  B. Dexilant 60 mg 1 time a day  4. If needed:   A. OTC antihistamine  B. nasal saline wash  C. ProAir HFA 2 puffs every 4-6 hours  D. Bepreve 1 drop each eye  twice a day.    5.  Consider a course of immunotherapy  6. Obtain fall flu vaccine  7.  Return to clinic in 1 year or earlier if problem  Kristy Walter needs to address some of her issues including her atopic respiratory disease and her reflux induced respiratory disease.  I made the recommendation that she start a nasal steroid on a pretty consistent basis and also start a proton pump inhibitor on a consistent basis to address these issues.  As well, she needs to come off all forms of caffeine to help with her LPR and perform allergen avoidance measures as best as possible to address her atopic disease.  She would be a candidate for immunotherapy and I have given her literature on this form of therapy once again during today's  visit and she is presently considering this option.  I have informed her that most of these upper airway scenarios that develop are probably viral in nature as are over 95% of these "sinusitis" events and usually do not require an antibiotic for clearing.  I will see her back in this clinic in 1 year or earlier if there is a problem.  Allena Katz, MD [facility]

## 2017-11-16 ENCOUNTER — Encounter: Payer: Self-pay | Admitting: Allergy and Immunology

## 2017-11-16 DIAGNOSIS — N951 Menopausal and female climacteric states: Secondary | ICD-10-CM | POA: Diagnosis not present

## 2017-11-16 DIAGNOSIS — R631 Polydipsia: Secondary | ICD-10-CM | POA: Diagnosis not present

## 2017-11-16 DIAGNOSIS — R944 Abnormal results of kidney function studies: Secondary | ICD-10-CM | POA: Diagnosis not present

## 2017-11-16 LAB — HEPATIC FUNCTION PANEL
ALT: 9 (ref 7–35)
AST: 20 (ref 13–35)
Alkaline Phosphatase: 74 (ref 25–125)
Bilirubin, Total: 0.2

## 2017-11-16 LAB — BASIC METABOLIC PANEL
BUN: 9 (ref 4–21)
Creatinine: 0.7 (ref 0.5–1.1)
Glucose: 91
Potassium: 4.7 (ref 3.4–5.3)
Sodium: 138 (ref 137–147)

## 2017-11-16 LAB — HEMOGLOBIN A1C: Hemoglobin A1C: 5.3

## 2017-11-21 DIAGNOSIS — Z9889 Other specified postprocedural states: Secondary | ICD-10-CM | POA: Diagnosis not present

## 2017-11-21 DIAGNOSIS — S21102D Unspecified open wound of left front wall of thorax without penetration into thoracic cavity, subsequent encounter: Secondary | ICD-10-CM | POA: Diagnosis not present

## 2017-12-08 IMAGING — CR DG CHEST 2V
2 series · 2 of 2 positions shown · non-contrast
Comparison: 10/31/2011

CLINICAL DATA: Cough and fever for 6 weeks, possible pneumonia

EXAM:
CHEST  2 VIEW

[w chest pa]
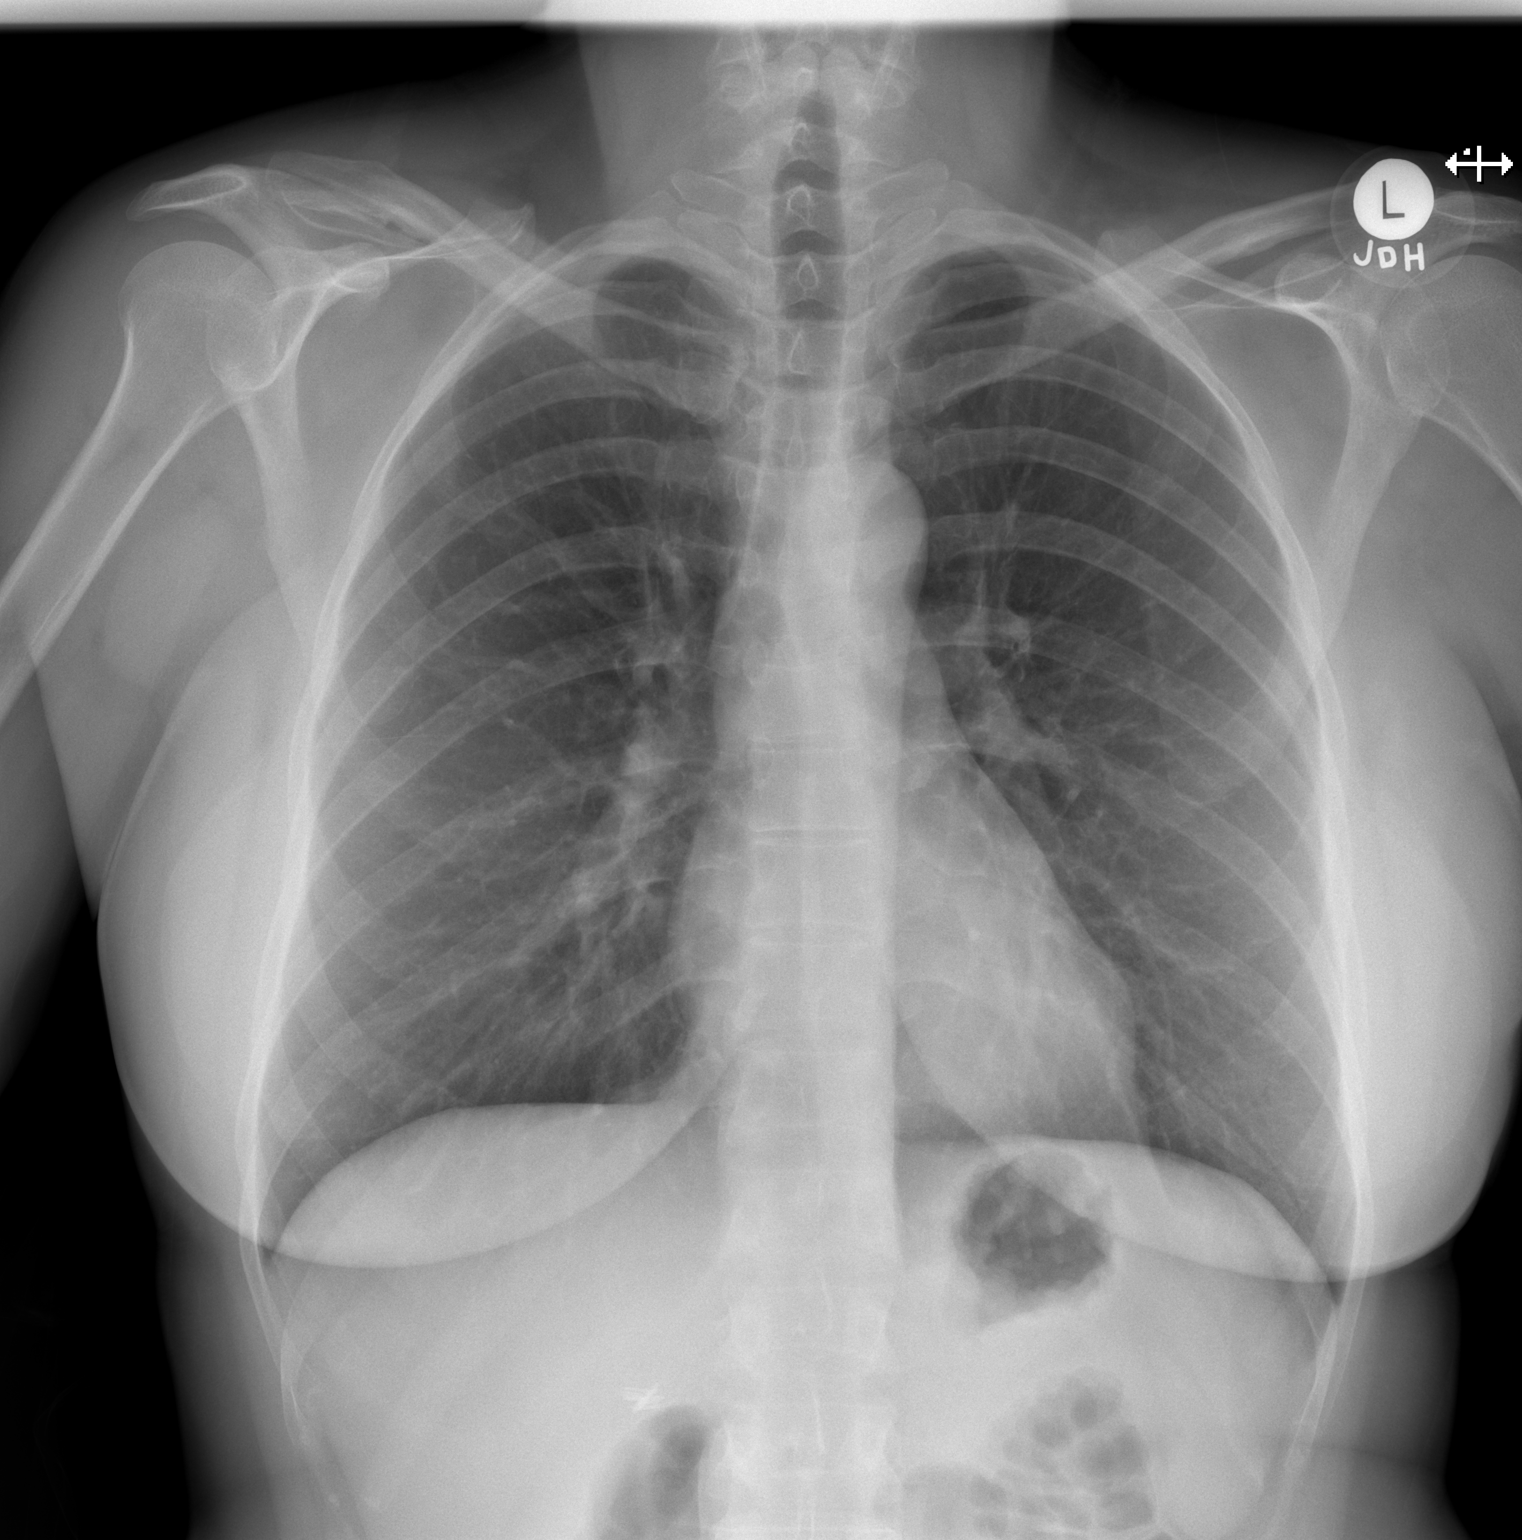

[w chest lat]
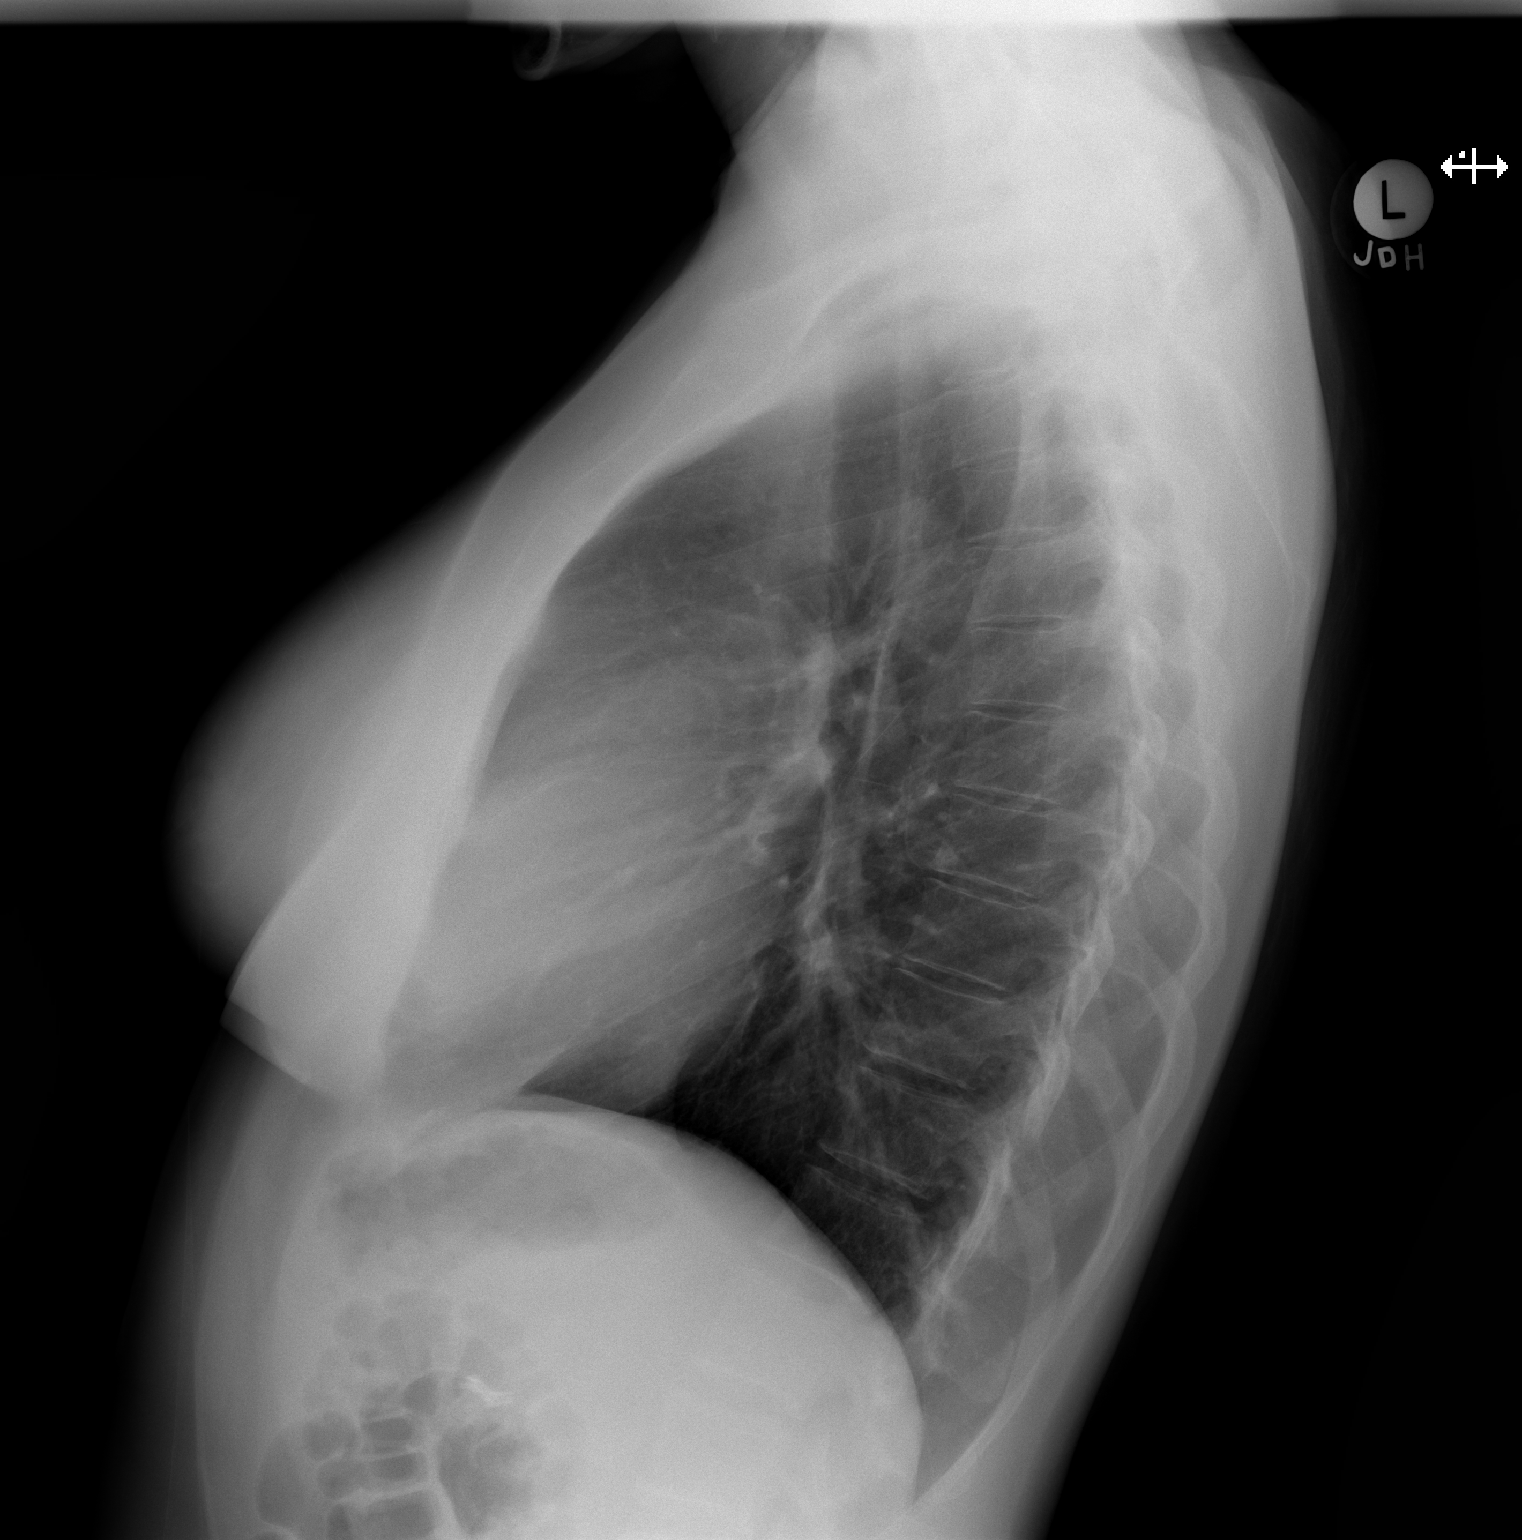

[2 of 2 positions shown; findings below may reference images not displayed]

FINDINGS: Cardiomediastinal silhouette is unremarkable. No acute infiltrate or
pleural effusion. No pulmonary edema. Bony thorax is unremarkable.
IMPRESSION: No active cardiopulmonary disease.

## 2018-01-04 DIAGNOSIS — Z23 Encounter for immunization: Secondary | ICD-10-CM | POA: Diagnosis not present

## 2018-01-07 ENCOUNTER — Encounter: Payer: Self-pay | Admitting: Internal Medicine

## 2018-01-07 DIAGNOSIS — N951 Menopausal and female climacteric states: Secondary | ICD-10-CM

## 2018-01-07 DIAGNOSIS — R631 Polydipsia: Secondary | ICD-10-CM

## 2018-01-08 ENCOUNTER — Other Ambulatory Visit: Payer: Self-pay | Admitting: Allergy and Immunology

## 2018-01-10 ENCOUNTER — Encounter: Payer: Self-pay | Admitting: Internal Medicine

## 2018-01-10 ENCOUNTER — Ambulatory Visit (INDEPENDENT_AMBULATORY_CARE_PROVIDER_SITE_OTHER): Payer: 59 | Admitting: Internal Medicine

## 2018-01-10 VITALS — BP 132/94 | HR 88 | Temp 98.2°F | Ht 66.75 in | Wt 184.0 lb

## 2018-01-10 DIAGNOSIS — R002 Palpitations: Secondary | ICD-10-CM | POA: Diagnosis not present

## 2018-01-10 DIAGNOSIS — E890 Postprocedural hypothyroidism: Secondary | ICD-10-CM | POA: Diagnosis not present

## 2018-01-10 DIAGNOSIS — N951 Menopausal and female climacteric states: Secondary | ICD-10-CM | POA: Diagnosis not present

## 2018-01-11 LAB — FOLLICLE STIMULATING HORMONE: FSH: 101 m[IU]/mL

## 2018-01-11 LAB — TESTOSTERONE: Testosterone: 18 ng/dL (ref 8–48)

## 2018-01-11 LAB — MAGNESIUM: Magnesium: 2 mg/dL (ref 1.6–2.3)

## 2018-01-12 NOTE — Progress Notes (Signed)
Here are your lab results:  Your serum testosterone is within normal limits.  Your [place] is elevated, this is suggestive that you are postmenopausal.  Your magnesium level is within normal limits.    How are you feeling on brand name Synthroid thus far?   Sincerely,    Panayiota Larkin N. Baird Cancer, MD

## 2018-01-18 NOTE — Progress Notes (Signed)
Subjective:     Patient ID: Kristy Walter , female    DOB: Oct 07, 1970 , 47 y.o.   MRN: [id number]   SHE IS HERE TODAY TO GO OVER HER SALIVA TEST RESULTS. SHE REPORTS SHE HAS BEEN FEELING BAD FOR MONTHS AND WANTS RELIEF. HAS BEEN HAVING HORMONAL ISSUES. GIVEN ESTRADIOL BY HER GYN, THIS DID NOT MAKE HER FEEL GOOD. SHE IS HOPING THAT BIOIDENTICAL HORMONES WILL BE HELPFUL.     Past Medical History:  Diagnosis Date  . Asthma    no inhaler  . Basal cell carcinoma (BCC) of female breast    left breast  . GERD (gastroesophageal reflux disease)   . Hypothyroidism   . Seasonal allergies   . Thyroid disease       Current Outpatient Medications:  .  Bepotastine Besilate (BEPREVE) 1.5 % SOLN, Place 1 drop into both eyes daily., Disp: 10 mL, Rfl: 5 .  cetirizine (ZYRTEC) 10 MG tablet, Take 10 mg by mouth daily., Disp: , Rfl:  .  dexlansoprazole (DEXILANT) 60 MG capsule, TAKE ONE CAPSULE BY MOUTH DAILY AS DIRECTED., Disp: 90 capsule, Rfl: 3 .  levothyroxine (SYNTHROID, LEVOTHROID) 100 MCG tablet, Take 100 mcg by mouth daily before breakfast., Disp: , Rfl:  .  montelukast (SINGULAIR) 10 MG tablet, Take one tablet once daily as directed., Disp: 90 tablet, Rfl: 3   Allergies  Allergen Reactions  . Sulfa Antibiotics Anaphylaxis    Pt states she can not breath  . Keflex [Cephalexin]   . Latex Hives  . Other Nausea And Vomiting    Bananas and Egg whites  . Sulfamethoxazole   . Sulfanilamide   . Trimethoprim      Review of Systems  Constitutional: Negative.   HENT: Negative.   Eyes: Negative.   Respiratory: Negative.   Cardiovascular: Negative.   Endocrine: Positive for heat intolerance.  Genitourinary: Negative.        POS HOT FLASHES POS NIGHT SWEATS  Hematological: Negative.   Psychiatric/Behavioral: Negative.      Today's Vitals   01/10/18 1409  BP: (!) 132/94  Pulse: 88  Temp: 98.2 F (36.8 C)  TempSrc: Oral  Weight: 184 lb (83.5 kg)  Height: 5' 6.75" (1.695 m)    Body mass index is 29.03 kg/m.   Objective:  Physical Exam  Constitutional: She is oriented to person, place, and time. She appears well-developed and well-nourished.  Eyes: EOM are normal.  Cardiovascular: Normal rate, regular rhythm and normal heart sounds.  Pulmonary/Chest: Effort normal and breath sounds normal.  Neurological: She is alert and oriented to person, place, and time.  Skin: Skin is warm and dry.  Psychiatric: She has a normal mood and affect.  Nursing note and vitals reviewed.       Assessment And Plan:     1. Female climacteric state  I WENT OVER HER SALIVA TEST RESULTS IN FULL DETAIL.  RESULTS SIGNIFICANT FOR NL ESTRONE, NL ESTRADIOL, NL ESTRIOL WHICH RESULTED IN NL ESTROGEN QUOTIENT. PROGESTERONE LEVELS ARE NL, WHICH RESULTED IN NL E2/PG RATIO. LASTLY, SHE HAD NL TESTOSTERONE LEVELS AND DHEA LEVELS.   - Testosterone, Total - FSH  2. Palpitations  PT ADVISED THAT THIS COMMONLY OCCURS WITH MENOPAUSE. SHE IS ENCOURAGED TO TAKE MG NIGHTLY TO HELP WITH THIS. SHE IS ALSO ENCOURAGED TO STAY WELL HYDRATED. I WILL CONSIDER FURTHER TESTING SHOULD HER SX PERSIST.  - Magnesium  3. Postablative hypothyroidism    I WILL CHECK A TSH. IMPORTANCE OF MEDICATION COMPLIANCE WAS DISCUSSED  WITH THE PATIENT.    Maximino Greenland, MD

## 2018-01-31 ENCOUNTER — Encounter: Payer: Self-pay | Admitting: Internal Medicine

## 2018-01-31 ENCOUNTER — Telehealth: Payer: Self-pay | Admitting: Internal Medicine

## 2018-01-31 NOTE — Telephone Encounter (Signed)
Pls call re: mgmt of thyroid.

## 2018-02-03 ENCOUNTER — Ambulatory Visit (INDEPENDENT_AMBULATORY_CARE_PROVIDER_SITE_OTHER): Payer: 59 | Admitting: Nurse Practitioner

## 2018-02-03 ENCOUNTER — Encounter: Payer: Self-pay | Admitting: Nurse Practitioner

## 2018-02-03 VITALS — BP 102/72 | HR 72 | Temp 97.9°F | Ht 66.5 in | Wt 183.4 lb

## 2018-02-03 DIAGNOSIS — R2232 Localized swelling, mass and lump, left upper limb: Secondary | ICD-10-CM | POA: Diagnosis not present

## 2018-02-03 DIAGNOSIS — Z85828 Personal history of other malignant neoplasm of skin: Secondary | ICD-10-CM

## 2018-02-03 NOTE — Progress Notes (Signed)
  Subjective:     Patient ID: Kristy Walter , female    DOB: 1970/11/19 , 47 y.o.   MRN: [id number]   Chief Complaint  Patient presents with  . Mass    pt states she has a lump under her left arm. patient states she has been having occassional sharp pain    HPI  She has found a lump underneath left arm. She had a mammogram in the last 6- 9 months.  She had a spot on the left breast in the past, with thoughts to be benign.  Denies family history of breast cancer.  Denies fever.      Past Medical History:  Diagnosis Date  . Asthma    no inhaler  . Basal cell carcinoma (BCC) of female breast    left breast  . GERD (gastroesophageal reflux disease)   . Hypothyroidism   . Seasonal allergies   . Thyroid disease      Family History  Problem Relation Age of Onset  . Cancer Mother   . Cervical cancer Sister   . Allergic rhinitis Brother   . Allergic rhinitis Son      Current Outpatient Medications:  .  cetirizine (ZYRTEC) 10 MG tablet, Take 10 mg by mouth daily., Disp: , Rfl:  .  dexlansoprazole (DEXILANT) 60 MG capsule, TAKE ONE CAPSULE BY MOUTH DAILY AS DIRECTED., Disp: 90 capsule, Rfl: 3 .  levothyroxine (SYNTHROID, LEVOTHROID) 100 MCG tablet, Take 100 mcg by mouth daily before breakfast., Disp: , Rfl:  .  montelukast (SINGULAIR) 10 MG tablet, Take one tablet once daily as directed., Disp: 90 tablet, Rfl: 3   Allergies  Allergen Reactions  . Sulfa Antibiotics Anaphylaxis    Pt states she can not breath  . Keflex [Cephalexin]   . Latex Hives  . Other Nausea And Vomiting    Bananas and Egg whites  . Sulfamethoxazole   . Sulfanilamide   . Trimethoprim      Review of Systems  Constitutional: Negative.   Respiratory: Negative.   Cardiovascular: Negative.   Gastrointestinal: Negative for nausea.  Musculoskeletal: Negative.   Skin: Negative.   Neurological: Negative.      Today's Vitals   02/03/18 1000  BP: 102/72  Pulse: 72  Temp: 97.9 F (36.6 C)    TempSrc: Oral  SpO2: 94%  Weight: 183 lb 6.4 oz (83.2 kg)  Height: 5' 6.5" (1.689 m)  PainSc: 0-No pain   Body mass index is 29.16 kg/m.   Objective:  Physical Exam  Constitutional: She is oriented to person, place, and time. She appears well-developed and well-nourished.  Cardiovascular: Normal rate, regular rhythm and normal heart sounds.  Pulmonary/Chest: Effort normal and breath sounds normal. No breast swelling, tenderness or discharge.    Neurological: She is alert and oriented to person, place, and time.  Skin: Skin is warm and dry. Capillary refill takes less than 2 seconds.  Psychiatric: She has a normal mood and affect.        Assessment And Plan:   1. Lump in armpit, left  Palpable enlarged area to left axilla  Will refer for ultrasound due to history of basal cell carcinoma to the skin of left breast. - [place] AXILLA LEFT; Future  2. History of basal cell carcinoma (BCC) of skin  Removed from left breast in August 2019          [name], [place]

## 2018-02-06 ENCOUNTER — Other Ambulatory Visit: Payer: Self-pay | Admitting: Nurse Practitioner

## 2018-02-06 DIAGNOSIS — R2232 Localized swelling, mass and lump, left upper limb: Secondary | ICD-10-CM

## 2018-02-10 ENCOUNTER — Ambulatory Visit
Admission: RE | Admit: 2018-02-10 | Discharge: 2018-02-10 | Disposition: A | Discharge status: Home / Self Care | Payer: 59 | Source: Ambulatory Visit | Attending: Nurse Practitioner | Admitting: Nurse Practitioner

## 2018-02-10 DIAGNOSIS — R2232 Localized swelling, mass and lump, left upper limb: Secondary | ICD-10-CM

## 2018-02-10 DIAGNOSIS — N6320 Unspecified lump in the left breast, unspecified quadrant: Secondary | ICD-10-CM | POA: Diagnosis not present

## 2018-02-10 DIAGNOSIS — R922 Inconclusive mammogram: Secondary | ICD-10-CM | POA: Diagnosis not present

## 2018-02-21 ENCOUNTER — Ambulatory Visit: Payer: 59 | Admitting: Internal Medicine

## 2018-02-28 ENCOUNTER — Ambulatory Visit: Payer: 59 | Admitting: Sports Medicine

## 2018-03-08 ENCOUNTER — Ambulatory Visit (INDEPENDENT_AMBULATORY_CARE_PROVIDER_SITE_OTHER): Payer: 59 | Admitting: Internal Medicine

## 2018-03-08 ENCOUNTER — Encounter: Payer: Self-pay | Admitting: Internal Medicine

## 2018-03-08 VITALS — BP 122/70 | HR 86 | Temp 98.2°F | Ht 66.5 in | Wt 183.8 lb

## 2018-03-08 DIAGNOSIS — E039 Hypothyroidism, unspecified: Secondary | ICD-10-CM

## 2018-03-08 DIAGNOSIS — N951 Menopausal and female climacteric states: Secondary | ICD-10-CM | POA: Insufficient documentation

## 2018-03-08 DIAGNOSIS — R002 Palpitations: Secondary | ICD-10-CM | POA: Diagnosis not present

## 2018-03-09 LAB — BMP8+EGFR
BUN/Creatinine Ratio: 15 (ref 9–23)
BUN: 11 mg/dL (ref 6–24)
CO2: 21 mmol/L (ref 20–29)
Calcium: 10 mg/dL (ref 8.7–10.2)
Chloride: 100 mmol/L (ref 96–106)
Creatinine, Ser: 0.73 mg/dL (ref 0.57–1.00)
GFR calc Af Amer: 113 mL/min/{1.73_m2} (ref >59)
GFR calc non Af Amer: 98 mL/min/{1.73_m2} (ref >59)
Glucose: 105 mg/dL — ABNORMAL HIGH (ref 65–99)
Potassium: 4.4 mmol/L (ref 3.5–5.2)
Sodium: 140 mmol/L (ref 134–144)

## 2018-03-09 LAB — T4, FREE: Free T4: 1.55 ng/dL (ref 0.82–1.77)

## 2018-03-09 LAB — TSH: TSH: 0.999 u[IU]/mL (ref 0.450–4.500)

## 2018-03-10 DIAGNOSIS — M67372 Transient synovitis, left ankle and foot: Secondary | ICD-10-CM | POA: Diagnosis not present

## 2018-03-10 DIAGNOSIS — M19072 Primary osteoarthritis, left ankle and foot: Secondary | ICD-10-CM | POA: Diagnosis not present

## 2018-03-10 DIAGNOSIS — M67371 Transient synovitis, right ankle and foot: Secondary | ICD-10-CM | POA: Diagnosis not present

## 2018-03-11 NOTE — Progress Notes (Signed)
Here are your lab results:  Your thyroid function is normal.  It is near perfect!   Do you still feel okay moving forward with the progesterone supplementation?   Happy holidays to you and your family! I pray hospice is able to keep your Mom pain-free.   Sincerely,    Shailah Gibbins N. Baird Cancer, MD

## 2018-04-02 ENCOUNTER — Encounter: Payer: Self-pay | Admitting: Internal Medicine

## 2018-04-02 NOTE — Progress Notes (Signed)
Subjective:     Patient ID: Kristy Walter , female    DOB: 11/21/1970 , 48 y.o.   MRN: [id number]   Chief Complaint  Patient presents with  . Hypothyroidism    HPI  Thyroid Problem  Presents for follow-up visit. Symptoms include palpitations (intermittent, unsure what triggers her sx. denies chest pain. ) and weight gain. Patient reports no depressed mood, diaphoresis or diarrhea. The symptoms have been improving.     Past Medical History:  Diagnosis Date  . Asthma    no inhaler  . Basal cell carcinoma (BCC) of female breast    left breast  . GERD (gastroesophageal reflux disease)   . Hypothyroidism   . Seasonal allergies   . Thyroid disease      Family History  Problem Relation Age of Onset  . Cancer Mother   . Lung cancer Mother   . Liver cancer Mother   . Cervical cancer Sister   . Allergic rhinitis Brother   . Allergic rhinitis Son   . Diabetes Father      Current Outpatient Medications:  .  cetirizine (ZYRTEC) 10 MG tablet, Take 10 mg by mouth daily., Disp: , Rfl:  .  dexlansoprazole (DEXILANT) 60 MG capsule, TAKE ONE CAPSULE BY MOUTH DAILY AS DIRECTED., Disp: 90 capsule, Rfl: 3 .  levothyroxine (SYNTHROID, LEVOTHROID) 100 MCG tablet, Take 100 mcg by mouth daily before breakfast. Monday - friday, Disp: , Rfl:  .  levothyroxine (SYNTHROID, LEVOTHROID) 112 MCG tablet, Take 112 mcg by mouth daily before breakfast. Saturday and Sunday, Disp: , Rfl:  .  montelukast (SINGULAIR) 10 MG tablet, Take one tablet once daily as directed., Disp: 90 tablet, Rfl: 3   Allergies  Allergen Reactions  . Sulfa Antibiotics Anaphylaxis    Pt states she can not breath  . Keflex [Cephalexin]   . Latex Hives  . Other Nausea And Vomiting    Bananas and Egg whites  . Sulfamethoxazole   . Sulfanilamide   . Trimethoprim      Review of Systems  Constitutional: Positive for weight gain. Negative for diaphoresis.  Respiratory: Negative.   Cardiovascular: Positive for  palpitations (intermittent, unsure what triggers her sx. denies chest pain. ).  Gastrointestinal: Negative for diarrhea.  Genitourinary: Negative.   Neurological: Negative.   Hematological: Negative.   Psychiatric/Behavioral: Negative.      Today's Vitals   03/08/18 1212  BP: 122/70  Pulse: 86  Temp: 98.2 F (36.8 C)  TempSrc: Oral  Weight: 183 lb 12.8 oz (83.4 kg)  Height: 5' 6.5" (1.689 m)   Body mass index is 29.22 kg/m.   Objective:  Physical Exam Constitutional:      Appearance: Normal appearance. She is obese.  HENT:     Head: Normocephalic and atraumatic.  Cardiovascular:     Rate and Rhythm: Normal rate and regular rhythm.     Heart sounds: Normal heart sounds.  Pulmonary:     Effort: Pulmonary effort is normal.     Breath sounds: Normal breath sounds.  Skin:    General: Skin is warm.  Neurological:     General: No focal deficit present.     Mental Status: She is alert.         Assessment And Plan:     1. Primary hypothyroidism  I will check a thyroid panel and adjust meds as needed. She will increase her dose to 156mg on Fri, Sat, Sunday.   - TSH - T4, Free  2. Palpitations  I will check her electrolytes today. I will also refer her for 2d echocardiogram. She is encouraged to take magnesium nightly.   - BMP8+EGFR - ECHOCARDIOGRAM COMPLETE; Future  3. Female climacteric state  She may benefit from progesterone nightly. This will help improve her sleep pattern.   Maximino Greenland, MD

## 2018-04-10 ENCOUNTER — Other Ambulatory Visit: Payer: Self-pay | Admitting: Allergy and Immunology

## 2018-04-24 ENCOUNTER — Encounter: Payer: Self-pay | Admitting: Internal Medicine

## 2018-04-24 ENCOUNTER — Inpatient Hospital Stay (HOSPITAL_COMMUNITY): Admission: RE | Admit: 2018-04-24 | Payer: 59 | Source: Ambulatory Visit

## 2018-04-28 ENCOUNTER — Inpatient Hospital Stay (HOSPITAL_COMMUNITY): Admission: RE | Admit: 2018-04-28 | Payer: 59 | Source: Ambulatory Visit

## 2018-05-02 ENCOUNTER — Encounter: Payer: Self-pay | Admitting: Internal Medicine

## 2018-05-02 ENCOUNTER — Ambulatory Visit (INDEPENDENT_AMBULATORY_CARE_PROVIDER_SITE_OTHER): Payer: 59 | Admitting: Internal Medicine

## 2018-05-02 VITALS — BP 116/74 | HR 82 | Temp 98.4°F | Ht 65.8 in | Wt 185.6 lb

## 2018-05-02 DIAGNOSIS — E039 Hypothyroidism, unspecified: Secondary | ICD-10-CM

## 2018-05-02 DIAGNOSIS — Z79899 Other long term (current) drug therapy: Secondary | ICD-10-CM

## 2018-05-02 DIAGNOSIS — R002 Palpitations: Secondary | ICD-10-CM

## 2018-05-02 DIAGNOSIS — E6609 Other obesity due to excess calories: Secondary | ICD-10-CM | POA: Diagnosis not present

## 2018-05-02 DIAGNOSIS — Z6830 Body mass index (BMI) 30.0-30.9, adult: Secondary | ICD-10-CM

## 2018-05-02 DIAGNOSIS — N951 Menopausal and female climacteric states: Secondary | ICD-10-CM

## 2018-05-03 ENCOUNTER — Ambulatory Visit (HOSPITAL_COMMUNITY)
Admission: RE | Admit: 2018-05-03 | Discharge: 2018-05-03 | Disposition: A | Discharge status: Home / Self Care | Payer: 59 | Source: Ambulatory Visit | Attending: Internal Medicine | Admitting: Internal Medicine

## 2018-05-03 DIAGNOSIS — R002 Palpitations: Secondary | ICD-10-CM | POA: Diagnosis not present

## 2018-05-03 DIAGNOSIS — E039 Hypothyroidism, unspecified: Secondary | ICD-10-CM | POA: Insufficient documentation

## 2018-05-03 DIAGNOSIS — I071 Rheumatic tricuspid insufficiency: Secondary | ICD-10-CM | POA: Insufficient documentation

## 2018-05-03 DIAGNOSIS — Z8249 Family history of ischemic heart disease and other diseases of the circulatory system: Secondary | ICD-10-CM | POA: Diagnosis not present

## 2018-05-03 LAB — T4, FREE: Free T4: 1.55 ng/dL (ref 0.82–1.77)

## 2018-05-03 LAB — TSH: TSH: 0.65 u[IU]/mL (ref 0.450–4.500)

## 2018-05-03 LAB — VITAMIN B12: Vitamin B-12: 380 pg/mL (ref 232–1245)

## 2018-05-03 NOTE — Progress Notes (Signed)
  Echocardiogram 2D Echocardiogram has been performed.  Kristy Walter 05/03/2018, 11:52 AM

## 2018-05-04 NOTE — Progress Notes (Signed)
Subjective:     Patient ID: Kristy Walter , female    DOB: [birth year] , 48 y.o.   MRN: [id number]   Chief Complaint  Patient presents with  . Hypothyroidism    HPI  Thyroid Problem  Presents for follow-up visit. Symptoms include weight gain.   She reports compliance with meds. She feels better on her new dosing regimen.  She is currently taking Synthroid 129mcg M-Th and 187mcg F-Sun.   Past Medical History:  Diagnosis Date  . Asthma    no inhaler  . Basal cell carcinoma (BCC) of female breast    left breast  . GERD (gastroesophageal reflux disease)   . Hypothyroidism   . Seasonal allergies   . Thyroid disease      Family History  Problem Relation Age of Onset  . Cancer Mother   . Lung cancer Mother   . Liver cancer Mother   . Cervical cancer Sister   . Allergic rhinitis Brother   . Allergic rhinitis Son   . Diabetes Father      Current Outpatient Medications:  .  cetirizine (ZYRTEC) 10 MG tablet, Take 10 mg by mouth daily., Disp: , Rfl:  .  dexlansoprazole (DEXILANT) 60 MG capsule, TAKE ONE CAPSULE BY MOUTH TWICE DAILY AS DIRECTED., Disp: 180 capsule, Rfl: 0 .  levothyroxine (SYNTHROID, LEVOTHROID) 100 MCG tablet, Take 100 mcg by mouth daily before breakfast. Monday - Thursday, Disp: , Rfl:  .  levothyroxine (SYNTHROID, LEVOTHROID) 112 MCG tablet, Take 112 mcg by mouth daily before breakfast. Friday ,Saturday, and Sunday, Disp: , Rfl:  .  montelukast (SINGULAIR) 10 MG tablet, Take one tablet once daily as directed., Disp: 90 tablet, Rfl: 3   Allergies  Allergen Reactions  . Sulfa Antibiotics Anaphylaxis    Pt states she can not breath  . Keflex [Cephalexin]   . Latex Hives  . Other Nausea And Vomiting    Bananas and Egg whites  . Sulfamethoxazole   . Sulfanilamide   . Trimethoprim      Review of Systems  Constitutional: Positive for weight gain.  Respiratory: Negative.   Cardiovascular: Negative.   Gastrointestinal: Negative.   Neurological:  Negative.   Psychiatric/Behavioral: Negative.      Today's Vitals   05/02/18 1209  BP: 116/74  Pulse: 82  Temp: 98.4 F (36.9 C)  TempSrc: Oral  Weight: 185 lb 9.6 oz (84.2 kg)  Height: 5' 5.8" (1.671 m)   Body mass index is 30.14 kg/m.   Objective:  Physical Exam Vitals signs and nursing note reviewed.  Constitutional:      Appearance: Normal appearance.  HENT:     Head: Normocephalic and atraumatic.  Cardiovascular:     Rate and Rhythm: Normal rate and regular rhythm.     Heart sounds: Normal heart sounds.  Pulmonary:     Effort: Pulmonary effort is normal.     Breath sounds: Normal breath sounds.  Skin:    General: Skin is warm.  Neurological:     General: No focal deficit present.     Mental Status: She is alert.         Assessment And Plan:     1. Primary hypothyroidism  I will check a thyroid panel and adjust meds as needed.   - TSH - T4, Free  2. Palpitations  Intermittent. She is encouraged to continue with magnesium supplementation. She is scheduled to have an echo in the near future.   3. Class 1 obesity due to  excess calories without serious comorbidity with body mass index (BMI) of 30.0 to 30.9 in adult  She is encouraged to strive for BMI less than 27 to decrease cardiac risk. She is encouraged to exercise at least 30 minutes five days weekly. She is also advised that it is necessary to optimize her thyroid function so this does not impede her efforts. .    4. Drug therapy  - Vitamin B12    5. Female climacteric state  We discussed starting use of progesterone cream nightly. She was advised to apply to inner thighs or behind knees nightly, except Sundays. I will call this into [place] as she requested. She will rto in six weeks for re-evaluation. She is advised to notify me if she develops any breast tenderness, joint pains, and mood changes.   Maximino Greenland, MD

## 2018-05-08 ENCOUNTER — Encounter: Payer: Self-pay | Admitting: Internal Medicine

## 2018-05-08 ENCOUNTER — Telehealth: Payer: Self-pay

## 2018-05-08 MED ORDER — ALBUTEROL SULFATE HFA 108 (90 BASE) MCG/ACT IN AERS: 2.0000 | INHALATION_SPRAY | Freq: Four times a day (QID) | RESPIRATORY_TRACT | 0 refills | Status: DC | PRN

## 2018-05-08 NOTE — Telephone Encounter (Signed)
Patient is calling to get a refill on her as needed inhaler. She was unsure of the name. She has started back running.    [facility]   Please Advise

## 2018-05-08 NOTE — Telephone Encounter (Signed)
Spoke with patient and she is aware that pro air has been sent in.

## 2018-05-09 ENCOUNTER — Encounter: Payer: Self-pay | Admitting: Internal Medicine

## 2018-05-10 ENCOUNTER — Other Ambulatory Visit: Payer: Self-pay

## 2018-05-10 MED ORDER — SYNTHROID 112 MCG PO TABS: ORAL_TABLET | ORAL | 1 refills | Status: DC

## 2018-05-15 ENCOUNTER — Other Ambulatory Visit: Payer: Self-pay

## 2018-05-15 MED ORDER — SYNTHROID 112 MCG PO TABS: ORAL_TABLET | ORAL | 1 refills | Status: DC

## 2018-06-06 DIAGNOSIS — E890 Postprocedural hypothyroidism: Secondary | ICD-10-CM | POA: Diagnosis not present

## 2018-06-07 DIAGNOSIS — E890 Postprocedural hypothyroidism: Secondary | ICD-10-CM | POA: Diagnosis not present

## 2018-06-07 DIAGNOSIS — R635 Abnormal weight gain: Secondary | ICD-10-CM | POA: Diagnosis not present

## 2018-06-08 ENCOUNTER — Telehealth: Payer: Self-pay

## 2018-06-08 NOTE — Telephone Encounter (Signed)
-----   Message from Glendale Chard, MD sent at 06/07/2018 11:00 PM EDT ----- Regarding: RE: Progesterone Did I answer this already? She should call pharmacy to let them know she has been breaking out in hives. They can change base ingredient.   Also, have her rub in behind her knees.   RS ----- Message ----- From: Michelle Nasuti, [place] Sent: 06/02/2018   3:54 PM EDT To: Glendale Chard, MD Subject: Progesterone                                   The pt wants to know if there is another site where she can apply her progesterone medication because she applies it to the inside of her thighs above her knees and it is causing her to have hives in the area she applies the cream.  The pt said I can call her back or she can get a response in her MyChart.

## 2018-06-08 NOTE — Telephone Encounter (Signed)
Pt.notified

## 2018-06-19 ENCOUNTER — Other Ambulatory Visit: Payer: Self-pay

## 2018-06-19 ENCOUNTER — Encounter: Payer: Self-pay | Admitting: Internal Medicine

## 2018-06-19 ENCOUNTER — Ambulatory Visit (INDEPENDENT_AMBULATORY_CARE_PROVIDER_SITE_OTHER): Payer: 59 | Admitting: Internal Medicine

## 2018-06-19 VITALS — BP 116/72 | HR 90 | Temp 98.3°F | Ht 66.0 in | Wt 186.4 lb

## 2018-06-19 DIAGNOSIS — E6609 Other obesity due to excess calories: Secondary | ICD-10-CM | POA: Diagnosis not present

## 2018-06-19 DIAGNOSIS — Z6830 Body mass index (BMI) 30.0-30.9, adult: Secondary | ICD-10-CM

## 2018-06-19 DIAGNOSIS — E039 Hypothyroidism, unspecified: Secondary | ICD-10-CM | POA: Diagnosis not present

## 2018-06-19 DIAGNOSIS — E8881 Metabolic syndrome: Secondary | ICD-10-CM

## 2018-06-19 DIAGNOSIS — N951 Menopausal and female climacteric states: Secondary | ICD-10-CM | POA: Diagnosis not present

## 2018-06-22 ENCOUNTER — Telehealth: Payer: Self-pay | Admitting: Nurse Practitioner

## 2018-06-22 ENCOUNTER — Ambulatory Visit: Payer: 59 | Admitting: Nurse Practitioner

## 2018-06-22 ENCOUNTER — Other Ambulatory Visit: Payer: Self-pay

## 2018-06-22 ENCOUNTER — Encounter: Payer: Self-pay | Admitting: Nurse Practitioner

## 2018-06-22 MED ORDER — PREDNISONE 10 MG (21) PO TBPK: ORAL_TABLET | ORAL | 0 refills | Status: DC

## 2018-06-22 NOTE — Progress Notes (Unsigned)
This visit type was conducted due to national recommendations for restrictions regarding the COVID-19 Pandemic (e.g. social distancing).  This format is felt to be most appropriate for this patient at this time.  All issues noted in this document were discussed and addressed.  No physical exam was performed (except for noted visual exam findings with Video Visits).  Please refer to the patient's chart (MyChart message for video visits and phone note for telephone visits) for the patient's consent to telehealth for [place].   Subjective:     Patient ID: Kristy Walter , female    DOB: 05-05-1970 , 48 y.o.   MRN: [id number]  Virtual Visit via Telephone Note  I connected with@ on 06/22/18 at  3:15 PM EDT by telephone and verified that I am speaking with the correct person using two identifiers.   I discussed the limitations, risks, security and privacy concerns of performing an evaluation and management service by telephone and the availability of in person appointments. I also discussed with the patient that there may be a patient responsible charge related to this service. The patient expressed understanding and agreed to proceed.  No chief complaint on file.   History of Present Illness: She is having congestion symtoms since Friday after being in the yard. Emergency, Zicam Singular, Zyrtec, Flonase nasal spray without relief.  She is taking Tylenol cold for daytime and nighttime.  Congestion is too much.  Denies fever.  She has drainage to passages.  She had been out in the air during.  Coughing up slight yellow sputum.  Feels the drainage when lying flat.    Past Medical History:  Diagnosis Date  . Asthma    no inhaler  . Basal cell carcinoma (BCC) of female breast    left breast  . GERD (gastroesophageal reflux disease)   . Hypothyroidism   . Seasonal allergies   . Thyroid disease      Family History  Problem Relation Age of Onset  . Cancer Mother   . Lung cancer Mother   .  Liver cancer Mother   . Cervical cancer Sister   . Allergic rhinitis Brother   . Allergic rhinitis Son   . Diabetes Father      Current Outpatient Medications:  .  albuterol (PROVENTIL HFA;VENTOLIN HFA) 108 (90 Base) MCG/ACT inhaler, Inhale 2 puffs into the lungs every 6 (six) hours as needed for wheezing or shortness of breath., Disp: 18 g, Rfl: 0 .  cetirizine (ZYRTEC) 10 MG tablet, Take 10 mg by mouth daily., Disp: , Rfl:  .  dexlansoprazole (DEXILANT) 60 MG capsule, TAKE ONE CAPSULE BY MOUTH TWICE DAILY AS DIRECTED., Disp: 180 capsule, Rfl: 0 .  fluticasone (FLONASE) 50 MCG/ACT nasal spray, Place into both nostrils daily., Disp: , Rfl:  .  levothyroxine (SYNTHROID, LEVOTHROID) 100 MCG tablet, Take 100 mcg by mouth daily before breakfast. Monday - Thursday, Disp: , Rfl:  .  metFORMIN (GLUCOPHAGE-XR) 500 MG 24 hr tablet, TAKE 1 TABLET BY MOUTH EVERY DAY IN THE EVENING WITH FOOD, Disp: , Rfl:  .  montelukast (SINGULAIR) 10 MG tablet, Take one tablet once daily as directed., Disp: 90 tablet, Rfl: 3 .  predniSONE (STERAPRED UNI-PAK 21 TAB) 10 MG (21) TBPK tablet, Take as directed, Disp: 21 tablet, Rfl: 0 .  SYNTHROID 112 MCG tablet, Take 1 tablet by mouth on Friday, Saturday, Sunday, Disp: 30 tablet, Rfl: 1   Allergies  Allergen Reactions  . Sulfa Antibiotics Anaphylaxis    Pt  states she can not breath  . Keflex [Cephalexin]   . Latex Hives  . Other Nausea And Vomiting    Bananas and Egg whites  . Sulfamethoxazole   . Sulfanilamide   . Trimethoprim      Review of Systems   There were no vitals filed for this visit.  Observations/Objective:        Assessment and Plan: There are no diagnoses linked to this encounter.  Follow Up Instructions:   I discussed the assessment and treatment plan with the patient. The patient was provided an opportunity to ask questions and all were answered. The patient agreed with the plan and demonstrated an understanding of the instructions.    The patient was advised to call back or seek an in-person evaluation if the symptoms worsen or if the condition fails to improve as anticipated.  COVID-19 Education: The signs and symptoms of COVID-19 were discussed with the patient and how to seek care for testing (follow up with PCP or arrange E-visit).  The importance of social distancing was discussed today.   Patient Risk:   After full review of this patients clinical status, I feel that they are at least moderate risk at this time.   I provided *** minutes of non-face-to-face time during this encounter.   [name], FNP

## 2018-06-22 NOTE — Telephone Encounter (Signed)
This visit type was conducted due to national recommendations for restrictions regarding the COVID-19 Pandemic (e.g. social distancing).  This format is felt to be most appropriate for this patient at this time.  All issues noted in this document were discussed and addressed.  No physical exam was performed (except for noted visual exam findings with Video Visits).  Please refer to the patient's chart (MyChart message for video visits and phone note for telephone visits) for the patient's consent to telehealth for [name].  Virtual Visit via Telephone Note  I connected with Kristy Walter on @TODAY @ at  by telephone and verified that I am speaking with the correct person using two identifiers.   I discussed the limitations, risks, security and privacy concerns of performing an evaluation and management service by telephone and the availability of in person appointments. I also discussed with the patient that there may be a patient responsible charge related to this service. The patient expressed understanding and agreed to proceed.   History of Present Illness: She is having congestion symtoms since Friday after being in the yard. Emergency, Zicam Singular, Zyrtec, Flonase nasal spray without relief.  She is taking Tylenol cold for daytime and nighttime.  Congestion is too much.  Denies fever.  She has drainage to passages.  She had been out in the air during.  Coughing up slight yellow sputum.  Feels the drainage when lying flat.   Observations/Objective:   Assessment and Plan: Allergic Rhinitis - will treat with prednisone dose pack    Follow Up Instructions:  If not better after the completion of regimen return call to office    COVID-19 Education: The signs and symptoms of COVID-19 were discussed with the patient and how to seek care for testing (follow up with PCP or arrange E-visit).  The importance of social distancing was discussed today.   Patient Risk:   After full review  of this patients clinical status, I feel that they are at least moderate risk at this time.  I discussed the assessment and treatment plan with the patient. The patient was provided an opportunity to ask questions and all were answered. The patient agreed with the plan and demonstrated an understanding of the instructions.   The patient was advised to call back or seek an in-person evaluation if the symptoms worsen or if the condition fails to improve as anticipated.  I provided 15 minutes of non-face-to-face time during this encounter.   Kristy Brine, FNP

## 2018-06-25 ENCOUNTER — Encounter: Payer: Self-pay | Admitting: Internal Medicine

## 2018-06-25 NOTE — Progress Notes (Signed)
Subjective:     Patient ID: Kristy Walter , female    DOB: 1970-08-17 , 48 y.o.   MRN: [id number]   Chief Complaint  Patient presents with  . hormones f/u    HPI  She is here today for f/u BHRT therapy. She was started on progesterone cream nightly except Sundays. She feels well on this regimen.   Of note, she has been seen by Dr. Chalmers Cater for f/u hypothyroidism. She adds that she discussed difficulty with weight loss was discussed during her visit. She reports she was started on metformin to address insulin resistance. She has not had any issues with the medication.     Past Medical History:  Diagnosis Date  . Asthma    no inhaler  . Basal cell carcinoma (BCC) of female breast    left breast  . GERD (gastroesophageal reflux disease)   . Hypothyroidism   . Seasonal allergies   . Thyroid disease      Family History  Problem Relation Age of Onset  . Cancer Mother   . Lung cancer Mother   . Liver cancer Mother   . Cervical cancer Sister   . Allergic rhinitis Brother   . Allergic rhinitis Son   . Diabetes Father      Current Outpatient Medications:  .  albuterol (PROVENTIL HFA;VENTOLIN HFA) 108 (90 Base) MCG/ACT inhaler, Inhale 2 puffs into the lungs every 6 (six) hours as needed for wheezing or shortness of breath., Disp: 18 g, Rfl: 0 .  cetirizine (ZYRTEC) 10 MG tablet, Take 10 mg by mouth daily., Disp: , Rfl:  .  dexlansoprazole (DEXILANT) 60 MG capsule, TAKE ONE CAPSULE BY MOUTH TWICE DAILY AS DIRECTED., Disp: 180 capsule, Rfl: 0 .  fluticasone (FLONASE) 50 MCG/ACT nasal spray, Place into both nostrils daily., Disp: , Rfl:  .  levothyroxine (SYNTHROID, LEVOTHROID) 100 MCG tablet, Take 100 mcg by mouth daily before breakfast. Monday - Thursday, Disp: , Rfl:  .  metFORMIN (GLUCOPHAGE-XR) 500 MG 24 hr tablet, TAKE 1 TABLET BY MOUTH EVERY DAY IN THE EVENING WITH FOOD, Disp: , Rfl:  .  montelukast (SINGULAIR) 10 MG tablet, Take one tablet once daily as directed., Disp: 90  tablet, Rfl: 3 .  SYNTHROID 112 MCG tablet, Take 1 tablet by mouth on Friday, Saturday, Sunday, Disp: 30 tablet, Rfl: 1 .  predniSONE (STERAPRED UNI-PAK 21 TAB) 10 MG (21) TBPK tablet, Take as directed, Disp: 21 tablet, Rfl: 0   Allergies  Allergen Reactions  . Sulfa Antibiotics Anaphylaxis    Pt states she can not breath  . Keflex [Cephalexin]   . Latex Hives  . Other Nausea And Vomiting    Bananas and Egg whites  . Sulfamethoxazole   . Sulfanilamide   . Trimethoprim      Review of Systems  Constitutional: Negative.   Respiratory: Negative.   Cardiovascular: Negative.   Gastrointestinal: Negative.   Neurological: Negative.   Psychiatric/Behavioral: Negative.      Today's Vitals   06/19/18 1600  BP: 116/72  Pulse: 90  Temp: 98.3 F (36.8 C)  TempSrc: Oral  Weight: 186 lb 6.4 oz (84.6 kg)  Height: 5\' 6"  (1.676 m)   Body mass index is 30.09 kg/m.   Objective:  Physical Exam Vitals signs and nursing note reviewed.  Constitutional:      Appearance: Normal appearance.  HENT:     Head: Normocephalic and atraumatic.  Cardiovascular:     Rate and Rhythm: Normal rate and regular rhythm.  Heart sounds: Normal heart sounds.  Pulmonary:     Effort: Pulmonary effort is normal.     Breath sounds: Normal breath sounds.  Skin:    General: Skin is warm.  Neurological:     General: No focal deficit present.     Mental Status: She is alert.  Psychiatric:        Mood and Affect: Mood normal.        Behavior: Behavior normal.         Assessment And Plan:     1. Female climacteric state  She will continue with progesterone nightly. She will rto in four months for re-evaluation.   2. Insulin resistance  I will request her records from Dr. Chalmers Cater. She will continue with metformin as per Dr. Chalmers Cater.   3. Primary hypothyroidism  She will continue with current thyroid regimen. I will recheck at her next visit.   4. Class 1 obesity due to excess calories without  serious comorbidity with body mass index (BMI) of 30.0 to 30.9 in adult  She is encouraged to exercise no less than 30 minutes five days weekly.   Maximino Greenland, MD

## 2018-08-01 ENCOUNTER — Other Ambulatory Visit: Payer: Self-pay | Admitting: Internal Medicine

## 2018-08-02 ENCOUNTER — Telehealth: Payer: Self-pay | Admitting: Internal Medicine

## 2018-08-02 NOTE — Telephone Encounter (Signed)
PT LVM TO CANCEL APPT ON 05/07-ATT TO CONTACT PT TO RESCHEDULE NO ANS LVM TO CALL OFC

## 2018-08-03 ENCOUNTER — Ambulatory Visit: Payer: 59 | Admitting: Internal Medicine

## 2018-08-10 ENCOUNTER — Encounter: Payer: Self-pay | Admitting: Internal Medicine

## 2018-08-16 ENCOUNTER — Encounter: Payer: Self-pay | Admitting: Internal Medicine

## 2018-08-16 ENCOUNTER — Other Ambulatory Visit: Payer: Self-pay

## 2018-08-16 ENCOUNTER — Ambulatory Visit (INDEPENDENT_AMBULATORY_CARE_PROVIDER_SITE_OTHER): Payer: 59 | Admitting: Internal Medicine

## 2018-08-16 VITALS — Ht 66.0 in

## 2018-08-16 DIAGNOSIS — E039 Hypothyroidism, unspecified: Secondary | ICD-10-CM | POA: Diagnosis not present

## 2018-08-16 DIAGNOSIS — E8881 Metabolic syndrome: Secondary | ICD-10-CM

## 2018-08-16 DIAGNOSIS — N951 Menopausal and female climacteric states: Secondary | ICD-10-CM | POA: Diagnosis not present

## 2018-08-21 NOTE — Progress Notes (Signed)
Virtual Visit via Video   This visit type was conducted due to national recommendations for restrictions regarding the COVID-19 Pandemic (e.g. social distancing) in an effort to limit this patient's exposure and mitigate transmission in our community.  Due to her co-morbid illnesses, this patient is at least at moderate risk for complications without adequate follow up.  This format is felt to be most appropriate for this patient at this time.  All issues noted in this document were discussed and addressed.  A limited physical exam was performed with this format.    This visit type was conducted due to national recommendations for restrictions regarding the COVID-19 Pandemic (e.g. social distancing) in an effort to limit this patient's exposure and mitigate transmission in our community.  Patients identity confirmed using two different identifiers.  This format is felt to be most appropriate for this patient at this time.  All issues noted in this document were discussed and addressed.  No physical exam was performed (except for noted visual exam findings with Video Visits).    Date:  08/21/2018   ID:  Kristy Walter, DOB 08-12-1970, MRN [id number]  Patient Location:  Work, she has coworker at next cubicle. Pt wants to proceed with the visit.   Provider location:   Office    Chief Complaint:  Metformin and BHRT f/u  History of Present Illness:    Kristy Walter is a 48 y.o. female who presents via video conferencing for a telehealth visit today.    The patient does not have symptoms concerning for COVID-19 infection (fever, chills, cough, or new shortness of breath).   She presents today for virtual visit. She prefers this method of contact due to COVID-19 pandemic.  She presents today for f/u insulin resistance and BHRT f/u. She has been taking metformin without any side effects. She reports she feels better since taking the medication. She has more energy. She has been exercising  regularly. She is eager to see if her insulin levels have improved.   She has been using progesterone cream nightly without any issues. She reports she is sleeping well. She is not having any hot flashes.     Past Medical History:  Diagnosis Date  . Asthma    no inhaler  . Basal cell carcinoma (BCC) of female breast    left breast  . GERD (gastroesophageal reflux disease)   . Hypothyroidism   . Seasonal allergies   . Thyroid disease    Past Surgical History:  Procedure Laterality Date  . AUGMENTATION MAMMAPLASTY    . BILATERAL SALPINGECTOMY Bilateral 06/15/2012   Procedure: BILATERAL SALPINGECTOMY;  Surgeon: Lovenia Kim, MD;  Location: [facility];  Service: Gynecology;  Laterality: Bilateral;  BILATERAL SALPINGECTOMY  . BREAST ENHANCEMENT SURGERY    . CHOLECYSTECTOMY    . CLAVICLE SURGERY    . MASS EXCISION Left 11/03/2017   Procedure: EXCISION OF LEFT BREAST SKIN CANCER;  Surgeon: Wallace Going, DO;  Location: [facility];  Service: Plastics;  Laterality: Left;  . NASAL SINUS SURGERY    . ROBOTIC ASSISTED TOTAL HYSTERECTOMY N/A 06/15/2012   Procedure: ROBOTIC ASSISTED TOTAL HYSTERECTOMY;  Surgeon: Lovenia Kim, MD;  Location: [facility];  Service: Gynecology;  Laterality: N/A;  ROBOTIC ASSISTED TOTAL HYSTERECTOMY  . Small tummy tuck     July 22nd 2019 at [facility]  . TONSILLECTOMY       Current Meds  Medication Sig  . albuterol (PROVENTIL HFA;VENTOLIN HFA) 108 (  90 Base) MCG/ACT inhaler Inhale 2 puffs into the lungs every 6 (six) hours as needed for wheezing or shortness of breath.  . cetirizine (ZYRTEC) 10 MG tablet Take 10 mg by mouth daily.  Marland Kitchen dexlansoprazole (DEXILANT) 60 MG capsule TAKE ONE CAPSULE BY MOUTH TWICE DAILY AS DIRECTED.  . fluticasone (FLONASE) 50 MCG/ACT nasal spray Place into both nostrils daily.  Marland Kitchen levothyroxine (SYNTHROID, LEVOTHROID) 100 MCG tablet Take 100 mcg by mouth daily before breakfast. Monday - Thursday  . metFORMIN  (GLUCOPHAGE-XR) 500 MG 24 hr tablet TAKE 1 TABLET BY MOUTH EVERY DAY IN THE EVENING WITH FOOD  . montelukast (SINGULAIR) 10 MG tablet Take one tablet once daily as directed.  Marland Kitchen SYNTHROID 112 MCG tablet TAKE 1 TABLET BY MOUTH ON FRIDAY, SATURDAY, SUNDAY     Allergies:   Sulfa antibiotics; Keflex [cephalexin]; Latex; Other; Sulfamethoxazole; Sulfanilamide; and Trimethoprim   Social History   Tobacco Use  . Smoking status: Never Smoker  . Smokeless tobacco: Never Used  Substance Use Topics  . Alcohol use: Yes    Comment: rarely  . Drug use: No     Family Hx: The patient's family history includes Allergic rhinitis in her brother and son; Cancer in her mother; Cervical cancer in her sister; Diabetes in her father; Liver cancer in her mother; Lung cancer in her mother.  ROS:   Please see the history of present illness.    Review of Systems  Constitutional: Negative.   Respiratory: Negative.   Cardiovascular: Negative.   Gastrointestinal: Negative.   Neurological: Negative.   Psychiatric/Behavioral: Negative.     All other systems reviewed and are negative.   Labs/Other Tests and Data Reviewed:    Recent Labs: 11/16/2017: ALT 9 01/10/2018: Magnesium 2.0 03/08/2018: BUN 11; Creatinine, Ser 0.73; Potassium 4.4; Sodium 140 05/02/2018: TSH 0.650   Recent Lipid Panel No results found for: CHOL, TRIG, HDL, CHOLHDL, LDLCALC, LDLDIRECT  Wt Readings from Last 3 Encounters:  06/19/18 186 lb 6.4 oz (84.6 kg)  05/02/18 185 lb 9.6 oz (84.2 kg)  03/08/18 183 lb 12.8 oz (83.4 kg)     Exam:    Vital Signs:  Ht 5\' 6"  (1.676 m)   LMP 05/31/2012   BMI 30.09 kg/m     Physical Exam  Constitutional: She is oriented to person, place, and time and well-developed, well-nourished, and in no distress.  HENT:  Head: Normocephalic and atraumatic.  Neck: Normal range of motion.  Pulmonary/Chest: Effort normal.  Neurological: She is alert and oriented to person, place, and time.   Psychiatric: Affect normal.  Nursing note and vitals reviewed.   ASSESSMENT & PLAN:     1. Insulin resistance  She agrees to lab visit. I will check bmet, hba1c and insulin level.   2. Female climacteric state  Chronic. She will continue with progesterone nightly.   3. Primary hypothyroidism  I will check Tsh at her next lab visit. She will continue with current meds for now.      COVID-19 Education: The signs and symptoms of COVID-19 were discussed with the patient and how to seek care for testing (follow up with PCP or arrange E-visit).  The importance of social distancing was discussed today.  Patient Risk:   After full review of this patients clinical status, I feel that they are at least moderate risk at this time.  Time:   Today, I have spent 9 minutes/ 10 seconds with the patient with telehealth technology discussing above diagnoses.  Medication Adjustments/Labs and Tests Ordered: Current medicines are reviewed at length with the patient today.  Concerns regarding medicines are outlined above.   Tests Ordered: No orders of the defined types were placed in this encounter.   Medication Changes: No orders of the defined types were placed in this encounter.   Disposition:  Follow up in 4 month(s)  Signed, Maximino Greenland, MD

## 2018-09-08 ENCOUNTER — Telehealth: Payer: Self-pay

## 2018-09-08 NOTE — Telephone Encounter (Signed)
Patient called stating she wants to be tested for COVID due to her being exposed to someone who tested positive I returned pt call to offer her a virtual appointment and she stated [facility] and she give [place] a call back to schedule an appointment once she finds out where the results are. Patient did state that she has a headache and sore throat. YRL,RMA

## 2018-10-16 ENCOUNTER — Telehealth: Payer: Self-pay | Admitting: Internal Medicine

## 2018-10-16 NOTE — Telephone Encounter (Signed)
PT LVM REQ APPT ATT TO CONTACT PT NO ANS LVM TO CALL OFC

## 2018-11-22 ENCOUNTER — Encounter: Payer: Self-pay | Admitting: Internal Medicine

## 2019-01-07 ENCOUNTER — Other Ambulatory Visit: Payer: Self-pay | Admitting: Internal Medicine

## 2019-01-08 ENCOUNTER — Telehealth: Payer: Self-pay | Admitting: Allergy and Immunology

## 2019-01-08 ENCOUNTER — Other Ambulatory Visit: Payer: Self-pay | Admitting: *Deleted

## 2019-01-08 MED ORDER — MONTELUKAST SODIUM 10 MG PO TABS: ORAL_TABLET | ORAL | 0 refills | Status: DC

## 2019-01-08 NOTE — Telephone Encounter (Signed)
Patient is requesting a courtesy refill for Singulair. Last seen 11-15-17. She made an appt for 01-30-19. [place]

## 2019-01-08 NOTE — Telephone Encounter (Signed)
Courtesy refill has been sent in to requested pharmacy. Called patient and informed. Patient verbalized understanding.

## 2019-01-10 ENCOUNTER — Encounter: Payer: Self-pay | Admitting: Internal Medicine

## 2019-01-10 ENCOUNTER — Ambulatory Visit: Payer: 59 | Admitting: Internal Medicine

## 2019-01-10 ENCOUNTER — Other Ambulatory Visit: Payer: Self-pay

## 2019-01-10 ENCOUNTER — Telehealth: Payer: Self-pay | Admitting: *Deleted

## 2019-01-10 VITALS — BP 110/72 | HR 76 | Temp 98.3°F | Ht 65.8 in | Wt 188.8 lb

## 2019-01-10 DIAGNOSIS — E039 Hypothyroidism, unspecified: Secondary | ICD-10-CM

## 2019-01-10 DIAGNOSIS — N951 Menopausal and female climacteric states: Secondary | ICD-10-CM

## 2019-01-10 DIAGNOSIS — Z6830 Body mass index (BMI) 30.0-30.9, adult: Secondary | ICD-10-CM

## 2019-01-10 DIAGNOSIS — E8881 Metabolic syndrome: Secondary | ICD-10-CM

## 2019-01-10 DIAGNOSIS — E6609 Other obesity due to excess calories: Secondary | ICD-10-CM | POA: Diagnosis not present

## 2019-01-10 NOTE — Patient Instructions (Signed)
Rybelsus - indicated for diabetes, can be effective in insulin resistance  Preventing Influenza, Adult Influenza, more commonly known as "the flu," is a viral infection that mainly affects the respiratory tract. The respiratory tract includes structures that help you breathe, such as the lungs, nose, and throat. The flu causes many common cold symptoms, as well as a high fever and body aches. The flu spreads easily from person to person (is contagious). The flu is most common from December through March. This is called flu season.You can catch the flu virus by:  Breathing in droplets from an infected person's cough or sneeze.  Touching something that was recently contaminated with the virus and then touching your mouth, nose, or eyes. What can I do to lower my risk?        You can decrease your risk of getting the flu by:  Getting a flu shot (influenza vaccination) every year. This is the best way to prevent the flu. A flu shot is recommended for everyone age 37 months and older. ? It is best to get a flu shot in the fall, as soon as it is available. Getting a flu shot during winter or spring instead is still a good idea. Flu season can last into early spring. ? Preventing the flu through vaccination requires getting a new flu shot every year. This is because the flu virus changes slightly (mutates) from one year to the next. Even if a flu shot does not completely protect you from all flu virus mutations, it can reduce the severity of your illness and prevent dangerous complications of the flu. ? If you are pregnant, you can and should get a flu shot. ? If you have had a reaction to the shot in the past or if you are allergic to eggs, check with your health care provider before getting a flu shot. ? Sometimes the vaccine is available as a nasal spray. In some years, the nasal spray has not been as effective against the flu virus. Check with your health care provider if you have questions  about this.  Practicing good health habits. This is especially important during flu season. ? Avoid contact with people who are sick with flu or cold symptoms. ? Wash your hands with soap and water often. If soap and water are not available, use alcohol-based hand sanitizer. ? Avoid touching your hands to your face, especially when you have not washed your hands recently. ? Use a disinfectant to clean surfaces at home and at work that may be contaminated with the flu virus. ? Keep your body's disease-fighting system (immune system) in good shape by eating a healthy diet, drinking plenty of fluids, getting enough sleep, and exercising regularly. If you do get the flu, avoid spreading it to others by:  Staying home until your symptoms have been gone for at least one day.  Covering your mouth and nose when you cough or sneeze.  Avoiding close contact with others, especially babies and elderly people. Why are these changes important? Getting a flu shot and practicing good health habits protects you as well as other people. If you get the flu, your friends, family, and co-workers are also at risk of getting it, because it spreads so easily to others. Each year, about 2 out of every 10 people get the flu. Having the flu can lead to complications, such as pneumonia, ear infection, and sinus infection. The flu also can be deadly, especially for babies, people older than age 46,  and people who have serious long-term diseases. How is this treated? Most people recover from the flu by resting at home and drinking plenty of fluids. However, a prescription antiviral medicine may reduce your flu symptoms and may make your flu go away sooner. This medicine must be started within a few days of getting flu symptoms. You can talk with your health care provider about whether you need an antiviral medicine. Antiviral medicine may be prescribed for people who are at risk for more serious flu symptoms. This includes  people who:  Are older than age 67.  Are pregnant.  Have a condition that makes the flu worse or more dangerous. Where to find more information  Centers for Disease Control and Prevention: http://www.smith-bell.org/  [facility].[employer].ee: azureicus.com  American Academy of Family Physicians: familydoctor.org/familydoctor/en/kids/vaccines/preventing-the-flu.html Contact a health care provider if:  You have influenza and you develop new symptoms.  You have: ? Chest pain. ? Diarrhea. ? A fever.  Your cough gets worse, or you produce more mucus. Summary  The best way to prevent the flu is to get a flu shot every year in the fall.  Even if you get the flu after you have received the yearly vaccine, your flu may be milder and go away sooner because of your flu shot.  If you get the flu, antiviral medicines that are started with a few days of symptoms may reduce your flu symptoms and may make your flu go away sooner.  You can also help prevent the flu by practicing good health habits. This information is not intended to replace advice given to you by your health care provider. Make sure you discuss any questions you have with your health care provider. Document Released: 03/30/2015 Document Revised: 02/25/2017 Document Reviewed: 11/22/2015 Elsevier Patient Education  2020 [employer].

## 2019-01-10 NOTE — Telephone Encounter (Signed)
PA submitted through covermymeds for montelukast.   This request has received a Favorable outcome. Please note any additional information provided by [employer] at the bottom of this request  PA approved.

## 2019-01-11 ENCOUNTER — Encounter: Payer: Self-pay | Admitting: Internal Medicine

## 2019-01-11 LAB — THYROID PEROXIDASE ANTIBODY: Thyroperoxidase Ab SerPl-aCnc: 12 IU/mL (ref 0–34)

## 2019-01-11 LAB — PROGESTERONE: Progesterone: 0.2 ng/mL

## 2019-01-11 LAB — T4, FREE: Free T4: 1.24 ng/dL (ref 0.82–1.77)

## 2019-01-11 LAB — INSULIN, RANDOM: INSULIN: 37.4 u[IU]/mL — ABNORMAL HIGH (ref 2.6–24.9)

## 2019-01-11 LAB — ESTRADIOL: Estradiol: <5 pg/mL

## 2019-01-11 LAB — TSH: TSH: 1.02 u[IU]/mL (ref 0.450–4.500)

## 2019-01-11 NOTE — Telephone Encounter (Signed)
PA approved for montelukast. Faxed to pharmacy and scanned.

## 2019-01-11 NOTE — Progress Notes (Signed)
Subjective:     Patient ID: Kristy Walter , female    DOB: [birth year] , 48 y.o.   MRN: [id number]   Chief Complaint  Patient presents with  . Thyroid Problem    HPI  Thyroid Problem Presents for follow-up visit. Patient reports no cold intolerance, constipation, depressed mood or diaphoresis. The symptoms have been stable.     Past Medical History:  Diagnosis Date  . Asthma    no inhaler  . Basal cell carcinoma (BCC) of female breast    left breast  . GERD (gastroesophageal reflux disease)   . Hypothyroidism   . Seasonal allergies   . Thyroid disease      Family History  Problem Relation Age of Onset  . Cancer Mother   . Lung cancer Mother   . Liver cancer Mother   . Cervical cancer Sister   . Allergic rhinitis Brother   . Allergic rhinitis Son   . Diabetes Father      Current Outpatient Medications:  .  albuterol (PROVENTIL HFA;VENTOLIN HFA) 108 (90 Base) MCG/ACT inhaler, Inhale 2 puffs into the lungs every 6 (six) hours as needed for wheezing or shortness of breath., Disp: 18 g, Rfl: 0 .  cetirizine (ZYRTEC) 10 MG tablet, Take 10 mg by mouth daily., Disp: , Rfl:  .  dexlansoprazole (DEXILANT) 60 MG capsule, TAKE ONE CAPSULE BY MOUTH TWICE DAILY AS DIRECTED., Disp: 180 capsule, Rfl: 0 .  fluticasone (FLONASE) 50 MCG/ACT nasal spray, Place into both nostrils daily., Disp: , Rfl:  .  levothyroxine (SYNTHROID, LEVOTHROID) 100 MCG tablet, Take 100 mcg by mouth daily before breakfast. Monday - Thursday, Disp: , Rfl:  .  montelukast (SINGULAIR) 10 MG tablet, Take one tablet once daily as directed., Disp: 30 tablet, Rfl: 0 .  SYNTHROID 112 MCG tablet, TAKE 1 TABLET BY MOUTH ON FRIDAY, SATURDAY, SUNDAY, Disp: 36 tablet, Rfl: 0 .  metFORMIN (GLUCOPHAGE-XR) 500 MG 24 hr tablet, TAKE 1 TABLET BY MOUTH EVERY DAY IN THE EVENING WITH FOOD, Disp: , Rfl:    Allergies  Allergen Reactions  . Sulfa Antibiotics Anaphylaxis    Pt states she can not breath  . Keflex [Cephalexin]    . Latex Hives  . Other Nausea And Vomiting    Bananas and Egg whites  . Sulfamethoxazole   . Sulfanilamide   . Trimethoprim      Review of Systems  Constitutional: Negative.  Negative for diaphoresis.  Respiratory: Negative.   Cardiovascular: Negative.   Gastrointestinal: Negative.  Negative for constipation.  Endocrine: Negative for cold intolerance.  Neurological: Negative.   Psychiatric/Behavioral: Negative.      Today's Vitals   01/10/19 1634  BP: 110/72  Pulse: 76  Temp: 98.3 F (36.8 C)  TempSrc: Oral  SpO2: 95%  Weight: 188 lb 12.8 oz (85.6 kg)  Height: 5' 5.8" (1.671 m)   Body mass index is 30.66 kg/m.   Objective:  Physical Exam Vitals signs and nursing note reviewed.  Constitutional:      Appearance: Normal appearance.  HENT:     Head: Normocephalic and atraumatic.  Cardiovascular:     Rate and Rhythm: Normal rate and regular rhythm.     Heart sounds: Normal heart sounds.  Pulmonary:     Effort: Pulmonary effort is normal.     Breath sounds: Normal breath sounds.  Skin:    General: Skin is warm.  Neurological:     General: No focal deficit present.     Mental Status:  She is alert.  Psychiatric:        Mood and Affect: Mood normal.        Behavior: Behavior normal.         Assessment And Plan:     1. Primary hypothyroidism  I will check thyroid panel and adjust meds as needed.  - TSH - T4, Free - Thyroid Peroxidase Antibody  2. Insulin resistance  I will recheck insulin level. We discussed the possible use of Rybelsus to address this condition. She denies family history of thyroid cancer. Possible side effects were discussed with the patient.  I will make further recommendations once her labs are available for review.   - Insulin, random(561)  3. 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 She would like to increase her progesterone cream to four clicks nightly except Sundays. She reports she  did this by accident for two weeks, and felt the best ever. I will call refill progesterone 4% cream into [facility]. She is encouraged to touch base with me in two weeks to let me know how she is doing. All questions were answered to her satisfaction. She will rto in four months for re-evaluation. I plan to recheck saliva test in Jan.   - Estradiol - Progesterone  4. Class 1 obesity due to excess calories without serious comorbidity with body mass index (BMI) of 30.0 to 30.9 in adult  She was commended on her regular exercise routine. She is encouraged to exercise no less than 150 minutes per week. Pt advised that use of Rybelsus may augment her weight loss efforts.         Maximino Greenland, MD    THE PATIENT IS ENCOURAGED TO PRACTICE SOCIAL DISTANCING DUE TO THE COVID-19 PANDEMIC.

## 2019-01-15 ENCOUNTER — Telehealth: Payer: 59 | Admitting: Internal Medicine

## 2019-01-16 ENCOUNTER — Encounter: Payer: Self-pay | Admitting: Internal Medicine

## 2019-01-26 LAB — HM MAMMOGRAPHY: HM Mammogram: NORMAL (ref 0–4)

## 2019-01-30 ENCOUNTER — Encounter: Payer: Self-pay | Admitting: Internal Medicine

## 2019-01-30 ENCOUNTER — Ambulatory Visit: Payer: 59 | Admitting: Allergy and Immunology

## 2019-02-01 ENCOUNTER — Other Ambulatory Visit: Payer: Self-pay | Admitting: Allergy and Immunology

## 2019-02-14 ENCOUNTER — Encounter: Payer: Self-pay | Admitting: Internal Medicine

## 2019-02-26 ENCOUNTER — Other Ambulatory Visit: Payer: Self-pay

## 2019-02-26 ENCOUNTER — Encounter: Payer: Self-pay | Admitting: Internal Medicine

## 2019-02-26 MED ORDER — MONTELUKAST SODIUM 10 MG PO TABS: ORAL_TABLET | ORAL | 1 refills | Status: DC

## 2019-02-27 ENCOUNTER — Encounter: Payer: Self-pay | Admitting: Internal Medicine

## 2019-02-27 ENCOUNTER — Ambulatory Visit: Payer: 59 | Admitting: Allergy and Immunology

## 2019-03-09 ENCOUNTER — Encounter: Payer: Self-pay | Admitting: Internal Medicine

## 2019-03-09 ENCOUNTER — Other Ambulatory Visit: Payer: Self-pay | Admitting: Internal Medicine

## 2019-03-19 ENCOUNTER — Encounter: Payer: Self-pay | Admitting: Internal Medicine

## 2019-03-20 ENCOUNTER — Telehealth: Payer: Self-pay

## 2019-03-20 NOTE — Telephone Encounter (Signed)
Pt wanted an appt for herself, and a new pt appt for her son. LVM for pt to call the office back to have someone schedule them an appt. Schedule pt mid Jan 2021 w/RS and her son in Jan 2021 w/JM

## 2019-03-20 NOTE — Telephone Encounter (Signed)
I apologize that I do not have any availability.    RS

## 2019-03-20 NOTE — Telephone Encounter (Signed)
Called pt to schedule her son a new pt appt w/JM. Pt stated she would like if he can be seen with RS if not she does not wish to schedule the appt. Her son will be leaving soon for the [employer]

## 2019-03-27 ENCOUNTER — Other Ambulatory Visit: Payer: Self-pay | Admitting: Internal Medicine

## 2019-05-16 ENCOUNTER — Ambulatory Visit: Payer: Self-pay | Admitting: Internal Medicine

## 2019-05-16 ENCOUNTER — Encounter: Payer: Self-pay | Admitting: Internal Medicine

## 2019-06-11 ENCOUNTER — Other Ambulatory Visit: Payer: Self-pay | Admitting: Allergy and Immunology

## 2019-06-11 ENCOUNTER — Encounter: Payer: Self-pay | Admitting: Internal Medicine

## 2019-06-11 ENCOUNTER — Other Ambulatory Visit: Payer: Self-pay

## 2019-06-11 MED ORDER — DEXILANT 60 MG PO CPDR: DELAYED_RELEASE_CAPSULE | ORAL | 0 refills | Status: DC

## 2019-06-19 ENCOUNTER — Telehealth: Payer: Self-pay

## 2019-06-19 NOTE — Telephone Encounter (Signed)
Called [employer] Prior Authorization Department  at [phone] to complete PA for progesterone cream referrance # [id number]

## 2019-06-21 ENCOUNTER — Telehealth: Payer: Self-pay

## 2019-06-21 NOTE — Telephone Encounter (Signed)
PA determination was received. Status says "cancelled" with instructions for the pharmacy to call help desk at [phone]. Called pharm and they said that they do not deal with insurance so I notified the pt and gave her the number.

## 2019-07-09 ENCOUNTER — Encounter: Payer: 59 | Admitting: Internal Medicine

## 2019-07-09 ENCOUNTER — Encounter: Payer: Self-pay | Admitting: Internal Medicine

## 2019-08-24 ENCOUNTER — Other Ambulatory Visit: Payer: Self-pay | Admitting: Internal Medicine

## 2019-09-23 ENCOUNTER — Encounter: Payer: Self-pay | Admitting: Internal Medicine

## 2019-10-07 ENCOUNTER — Encounter: Payer: Self-pay | Admitting: Internal Medicine

## 2019-10-15 ENCOUNTER — Encounter: Payer: Self-pay | Admitting: Internal Medicine

## 2019-10-15 ENCOUNTER — Other Ambulatory Visit: Payer: Self-pay | Admitting: Internal Medicine

## 2019-10-15 ENCOUNTER — Other Ambulatory Visit: Payer: Self-pay

## 2019-10-15 ENCOUNTER — Ambulatory Visit: Payer: 59 | Admitting: Internal Medicine

## 2019-10-15 VITALS — BP 118/64 | HR 77 | Temp 98.1°F | Ht 66.0 in | Wt 174.0 lb

## 2019-10-15 DIAGNOSIS — Z0000 Encounter for general adult medical examination without abnormal findings: Secondary | ICD-10-CM | POA: Diagnosis not present

## 2019-10-15 DIAGNOSIS — N951 Menopausal and female climacteric states: Secondary | ICD-10-CM

## 2019-10-15 DIAGNOSIS — E039 Hypothyroidism, unspecified: Secondary | ICD-10-CM | POA: Diagnosis not present

## 2019-10-15 NOTE — Progress Notes (Signed)
This visit occurred during the SARS-CoV-2 public health emergency.  Safety protocols were in place, including screening questions prior to the visit, additional usage of staff PPE, and extensive cleaning of exam room while observing appropriate contact time as indicated for disinfecting solutions.  Subjective:     Patient ID: Kristy Walter , female    DOB: 1971-02-19 , 49 y.o.   MRN: [id number]   Chief Complaint  Patient presents with  . Annual Exam    progesterone refill  . Immunizations    tdap    HPI  She is here today for a full physical exam. She is followed by Gustavo Lah at Dr. Kennith Maes office. She has her mammograms performed at this office as well. She has no specific concerns or complaints.     Past Medical History:  Diagnosis Date  . Asthma    no inhaler  . Basal cell carcinoma (BCC) of female breast    left breast  . GERD (gastroesophageal reflux disease)   . Hypothyroidism   . Seasonal allergies   . Thyroid disease      Family History  Problem Relation Age of Onset  . Cancer Mother   . Lung cancer Mother   . Liver cancer Mother   . Cervical cancer Sister   . Allergic rhinitis Brother   . Allergic rhinitis Son   . Diabetes Father      Current Outpatient Medications:  .  cetirizine (ZYRTEC) 10 MG tablet, Take 10 mg by mouth daily., Disp: , Rfl:  .  dexlansoprazole (DEXILANT) 60 MG capsule, TAKE ONE CAPSULE BY MOUTH  DAILY., Disp: 180 capsule, Rfl: 0 .  fluticasone (FLONASE) 50 MCG/ACT nasal spray, Place into both nostrils daily., Disp: , Rfl:  .  levothyroxine (SYNTHROID, LEVOTHROID) 100 MCG tablet, Take 100 mcg by mouth daily before breakfast. Monday - Thursday, Disp: , Rfl:  .  montelukast (SINGULAIR) 10 MG tablet, TAKE ONE TABLET ONCE DAILY AS DIRECTED., Disp: 90 tablet, Rfl: 1 .  SYNTHROID 112 MCG tablet, TAKE 1 TABLET BY MOUTH ON FRIDAY, SATURDAY, SUNDAY, Disp: 36 tablet, Rfl: 0   Allergies  Allergen Reactions  . Sulfa Antibiotics  Anaphylaxis    Pt states she can not breath  . Keflex [Cephalexin]   . Latex Hives  . Other Nausea And Vomiting    Bananas and Egg whites  . Sulfamethoxazole   . Sulfanilamide   . Trimethoprim       The patient states she uses none for birth control. Last LMP was Patient's last menstrual period was 05/31/2012.. Negative for Dysmenorrhea. Negative for: breast discharge, breast lump(s), breast pain and breast self exam. Associated symptoms include abnormal vaginal bleeding. Pertinent negatives include abnormal bleeding (hematology), anxiety, decreased libido, depression, difficulty falling sleep, dyspareunia, history of infertility, nocturia, sexual dysfunction, sleep disturbances, urinary incontinence, urinary urgency, vaginal discharge and vaginal itching. Diet regular.The patient states her exercise level is  moderate.  . The patient's tobacco use is:  Social History   Tobacco Use  Smoking Status Never Smoker  Smokeless Tobacco Never Used  . She has been exposed to passive smoke. The patient's alcohol use is:  Social History   Substance and Sexual Activity  Alcohol Use Yes   Comment: rarely    Review of Systems  Constitutional: Negative.   HENT: Negative.   Eyes: Negative.   Respiratory: Negative.   Cardiovascular: Negative.   Gastrointestinal: Negative.   Endocrine: Negative.   Genitourinary: Negative.   Musculoskeletal: Negative.  Skin: Negative.   Allergic/Immunologic: Negative.   Neurological: Negative.   Hematological: Negative.   Psychiatric/Behavioral: Negative.      Today's Vitals   10/15/19 1126  BP: 118/64  Pulse: 77  Temp: 98.1 F (36.7 C)  TempSrc: Oral  Weight: 174 lb (78.9 kg)  Height: _0  (1.676 m)   Body mass index is 28.08 kg/m.   Wt Readings from Last 3 Encounters:  10/15/19 174 lb (78.9 kg)  01/10/19 188 lb 12.8 oz (85.6 kg)  06/19/18 186 lb 6.4 oz (84.6 kg)     Objective:  Physical Exam Vitals and nursing note reviewed.   Constitutional:      Appearance: Normal appearance.  HENT:     Head: Normocephalic and atraumatic.     Right Ear: Tympanic membrane, ear canal and external ear normal.     Left Ear: Tympanic membrane, ear canal and external ear normal.     Nose: Nose normal.     Mouth/Throat:     Mouth: Mucous membranes are moist.     Pharynx: Oropharynx is clear.  Eyes:     Extraocular Movements: Extraocular movements intact.     Conjunctiva/sclera: Conjunctivae normal.     Pupils: Pupils are equal, round, and reactive to light.  Cardiovascular:     Rate and Rhythm: Normal rate and regular rhythm.     Pulses: Normal pulses.     Heart sounds: Normal heart sounds.  Pulmonary:     Effort: Pulmonary effort is normal.     Breath sounds: Normal breath sounds.  Chest:     Breasts: Tanner Score is 5.        Right: Normal.        Left: Normal.     Comments: Healed surgical scars b/l Abdominal:     General: Abdomen is flat. Bowel sounds are normal.     Palpations: Abdomen is soft.  Genitourinary:    Comments: deferred Musculoskeletal:        General: Normal range of motion.     Cervical back: Normal range of motion and neck supple.  Skin:    General: Skin is warm and dry.  Neurological:     General: No focal deficit present.     Mental Status: She is alert and oriented to person, place, and time.  Psychiatric:        Mood and Affect: Mood normal.        Behavior: Behavior normal.         Assessment And Plan:     1. Routine general medical examination at a health care facility  A full exam was performed. Importance of monthly self breast exams was discussed with the patient. I will also request last pap/mammo from her GYN. PATIENT IS ADVISED TO GET 30-45 MINUTES REGULAR EXERCISE NO LESS THAN FOUR TO FIVE DAYS PER WEEK - BOTH WEIGHTBEARING EXERCISES AND AEROBIC ARE RECOMMENDED.  PATIENT IS ADVISED TO FOLLOW A HEALTHY DIET WITH AT LEAST SIX FRUITS/VEGGIES PER DAY, DECREASE INTAKE OF RED MEAT,  AND TO INCREASE FISH INTAKE TO TWO DAYS PER WEEK.  MEATS/FISH SHOULD NOT BE FRIED, BAKED OR BROILED IS PREFERABLE.  I SUGGEST WEARING SPF 50 SUNSCREEN ON EXPOSED PARTS AND ESPECIALLY WHEN IN THE DIRECT SUNLIGHT FOR AN EXTENDED PERIOD OF TIME.  PLEASE AVOID FAST FOOD RESTAURANTS AND INCREASE YOUR WATER INTAKE.  - CMP14+EGFR - CBC - Lipid panel - Hemoglobin A1c - Hepatitis C antibody - HIV antibody (with reflex)  2. Primary hypothyroidism  I  will check thyroid panel and adjust meds as needed.  She will rto in six months for re-evaluation.   - TSH - T4, Free  3. Female climacteric state  She has noticed weight gain since running out of progesterone for 12 days. I will call in 90 day supply into [facility]. She will rto in six months for re-evaluation.   Patient was given opportunity to ask questions. Patient verbalized understanding of the plan and was able to repeat key elements of the plan. All questions were answered to their satisfaction.   Maximino Greenland, MD   I, Maximino Greenland, MD, have reviewed all documentation for this visit. The documentation on 10/15/19 for the exam, diagnosis, procedures, and orders are all accurate and complete.    THE PATIENT IS ENCOURAGED TO PRACTICE SOCIAL DISTANCING DUE TO THE COVID-19 PANDEMIC.    I,Katawbba Wiggins,acting as a Education administrator for Maximino Greenland, MD.,have documented all relevant documentation on the behalf of Maximino Greenland, MD,as directed by  Maximino Greenland, MD while in the presence of Maximino Greenland, MD.

## 2019-10-15 NOTE — Patient Instructions (Signed)
Health Maintenance, Female Adopting a healthy lifestyle and getting preventive care are important in promoting health and wellness. Ask your health care provider about:  The right schedule for you to have regular tests and exams.  Things you can do on your own to prevent diseases and keep yourself healthy. What should I know about diet, weight, and exercise? Eat a healthy diet   Eat a diet that includes plenty of vegetables, fruits, low-fat dairy products, and lean protein.  Do not eat a lot of foods that are high in solid fats, added sugars, or sodium. Maintain a healthy weight Body mass index (BMI) is used to identify weight problems. It estimates body fat based on height and weight. Your health care provider can help determine your BMI and help you achieve or maintain a healthy weight. Get regular exercise Get regular exercise. This is one of the most important things you can do for your health. Most adults should:  Exercise for at least 150 minutes each week. The exercise should increase your heart rate and make you sweat (moderate-intensity exercise).  Do strengthening exercises at least twice a week. This is in addition to the moderate-intensity exercise.  Spend less time sitting. Even light physical activity can be beneficial. Watch cholesterol and blood lipids Have your blood tested for lipids and cholesterol at 50 years of age, then have this test every 5 years. Have your cholesterol levels checked more often if:  Your lipid or cholesterol levels are high.  You are older than 50 years of age.  You are at high risk for heart disease. What should I know about cancer screening? Depending on your health history and family history, you may need to have cancer screening at various ages. This may include screening for:  Breast cancer.  Cervical cancer.  Colorectal cancer.  Skin cancer.  Lung cancer. What should I know about heart disease, diabetes, and high blood  pressure? Blood pressure and heart disease  High blood pressure causes heart disease and increases the risk of stroke. This is more likely to develop in people who have high blood pressure readings, are of African descent, or are overweight.  Have your blood pressure checked: ? Every 3-5 years if you are 18-39 years of age. ? Every year if you are 40 years old or older. Diabetes Have regular diabetes screenings. This checks your fasting blood sugar level. Have the screening done:  Once every three years after age 40 if you are at a normal weight and have a low risk for diabetes.  More often and at a younger age if you are overweight or have a high risk for diabetes. What should I know about preventing infection? Hepatitis B If you have a higher risk for hepatitis B, you should be screened for this virus. Talk with your health care provider to find out if you are at risk for hepatitis B infection. Hepatitis C Testing is recommended for:  Everyone born from 1945 through 1965.  Anyone with known risk factors for hepatitis C. Sexually transmitted infections (STIs)  Get screened for STIs, including gonorrhea and chlamydia, if: ? You are sexually active and are younger than 50 years of age. ? You are older than 50 years of age and your health care provider tells you that you are at risk for this type of infection. ? Your sexual activity has changed since you were last screened, and you are at increased risk for chlamydia or gonorrhea. Ask your health care provider if   you are at risk.  Ask your health care provider about whether you are at high risk for HIV. Your health care provider may recommend a prescription medicine to help prevent HIV infection. If you choose to take medicine to prevent HIV, you should first get tested for HIV. You should then be tested every 3 months for as long as you are taking the medicine. Pregnancy  If you are about to stop having your period (premenopausal) and  you may become pregnant, seek counseling before you get pregnant.  Take 400 to 800 micrograms (mcg) of folic acid every day if you become pregnant.  Ask for birth control (contraception) if you want to prevent pregnancy. Osteoporosis and menopause Osteoporosis is a disease in which the bones lose minerals and strength with aging. This can result in bone fractures. If you are 65 years old or older, or if you are at risk for osteoporosis and fractures, ask your health care provider if you should:  Be screened for bone loss.  Take a calcium or vitamin D supplement to lower your risk of fractures.  Be given hormone replacement therapy (HRT) to treat symptoms of menopause. Follow these instructions at home: Lifestyle  Do not use any products that contain nicotine or tobacco, such as cigarettes, e-cigarettes, and chewing tobacco. If you need help quitting, ask your health care provider.  Do not use street drugs.  Do not share needles.  Ask your health care provider for help if you need support or information about quitting drugs. Alcohol use  Do not drink alcohol if: ? Your health care provider tells you not to drink. ? You are pregnant, may be pregnant, or are planning to become pregnant.  If you drink alcohol: ? Limit how much you use to 0-1 drink a day. ? Limit intake if you are breastfeeding.  Be aware of how much alcohol is in your drink. In the [place] one drink equals one 12 oz bottle of beer (355 mL), one 5 oz glass of wine (148 mL), or one 1 oz glass of hard liquor (44 mL). General instructions  Schedule regular health, dental, and eye exams.  Stay current with your vaccines.  Tell your health care provider if: ? You often feel depressed. ? You have ever been abused or do not feel safe at home. Summary  Adopting a healthy lifestyle and getting preventive care are important in promoting health and wellness.  Follow your health care provider's instructions about healthy  diet, exercising, and getting tested or screened for diseases.  Follow your health care provider's instructions on monitoring your cholesterol and blood pressure. This information is not intended to replace advice given to you by your health care provider. Make sure you discuss any questions you have with your health care provider. Document Revised: 03/08/2018 Document Reviewed: 03/08/2018 Elsevier Patient Education  2020 [employer] Inc.  

## 2019-10-16 LAB — CMP14+EGFR
ALT: 13 IU/L (ref 0–32)
AST: 21 IU/L (ref 0–40)
Albumin/Globulin Ratio: 1.5 (ref 1.2–2.2)
Albumin: 4.8 g/dL (ref 3.8–4.8)
Alkaline Phosphatase: 90 IU/L (ref 48–121)
BUN/Creatinine Ratio: 14 (ref 9–23)
BUN: 10 mg/dL (ref 6–24)
Bilirubin Total: <0.2 mg/dL (ref 0.0–1.2)
CO2: 25 mmol/L (ref 20–29)
Calcium: 9.7 mg/dL (ref 8.7–10.2)
Chloride: 102 mmol/L (ref 96–106)
Creatinine, Ser: 0.7 mg/dL (ref 0.57–1.00)
GFR calc Af Amer: 118 mL/min/{1.73_m2} (ref >59)
GFR calc non Af Amer: 102 mL/min/{1.73_m2} (ref >59)
Globulin, Total: 3.1 g/dL (ref 1.5–4.5)
Glucose: 94 mg/dL (ref 65–99)
Potassium: 4.4 mmol/L (ref 3.5–5.2)
Sodium: 142 mmol/L (ref 134–144)
Total Protein: 7.9 g/dL (ref 6.0–8.5)

## 2019-10-16 LAB — HEMOGLOBIN A1C
Est. average glucose Bld gHb Est-mCnc: 117 mg/dL
Hgb A1c MFr Bld: 5.7 % — ABNORMAL HIGH (ref 4.8–5.6)

## 2019-10-16 LAB — CBC
Hematocrit: 40.5 % (ref 34.0–46.6)
Hemoglobin: 13.5 g/dL (ref 11.1–15.9)
MCH: 29.9 pg (ref 26.6–33.0)
MCHC: 33.3 g/dL (ref 31.5–35.7)
MCV: 90 fL (ref 79–97)
Platelets: 376 10*3/uL (ref 150–450)
RBC: 4.52 x10E6/uL (ref 3.77–5.28)
RDW: 12.6 % (ref 11.7–15.4)
WBC: 7.1 10*3/uL (ref 3.4–10.8)

## 2019-10-16 LAB — LIPID PANEL
Chol/HDL Ratio: 4.4 ratio (ref 0.0–4.4)
Cholesterol, Total: 226 mg/dL — ABNORMAL HIGH (ref 100–199)
HDL: 51 mg/dL (ref >39)
LDL Chol Calc (NIH): 151 mg/dL — ABNORMAL HIGH (ref 0–99)
Triglycerides: 136 mg/dL (ref 0–149)
VLDL Cholesterol Cal: 24 mg/dL (ref 5–40)

## 2019-10-16 LAB — HEPATITIS C ANTIBODY: Hep C Virus Ab: 0.1 s/co ratio (ref 0.0–0.9)

## 2019-10-16 LAB — TSH: TSH: 0.918 u[IU]/mL (ref 0.450–4.500)

## 2019-10-16 LAB — T4, FREE: Free T4: 1.44 ng/dL (ref 0.82–1.77)

## 2019-10-16 LAB — HIV ANTIBODY (ROUTINE TESTING W REFLEX): HIV Screen 4th Generation wRfx: NONREACTIVE

## 2019-10-18 ENCOUNTER — Other Ambulatory Visit: Payer: Self-pay

## 2019-10-18 ENCOUNTER — Encounter: Payer: Self-pay | Admitting: Internal Medicine

## 2019-10-18 MED ORDER — LEVOTHYROXINE SODIUM 100 MCG PO TABS: 100.0000 ug | ORAL_TABLET | Freq: Every day | ORAL | 1 refills | Status: DC

## 2019-10-23 ENCOUNTER — Encounter: Payer: Self-pay | Admitting: Internal Medicine

## 2019-11-01 ENCOUNTER — Encounter: Payer: Self-pay | Admitting: Internal Medicine

## 2019-11-08 ENCOUNTER — Other Ambulatory Visit: Payer: Self-pay | Admitting: Internal Medicine

## 2019-11-22 ENCOUNTER — Ambulatory Visit: Payer: 59 | Admitting: Internal Medicine

## 2019-12-17 ENCOUNTER — Other Ambulatory Visit: Payer: Self-pay

## 2019-12-17 ENCOUNTER — Ambulatory Visit (INDEPENDENT_AMBULATORY_CARE_PROVIDER_SITE_OTHER): Payer: 59 | Admitting: Internal Medicine

## 2019-12-17 ENCOUNTER — Encounter: Payer: Self-pay | Admitting: Internal Medicine

## 2019-12-17 VITALS — BP 120/62 | HR 80 | Temp 98.0°F | Ht 65.0 in | Wt 174.0 lb

## 2019-12-17 DIAGNOSIS — N951 Menopausal and female climacteric states: Secondary | ICD-10-CM

## 2019-12-17 DIAGNOSIS — R102 Pelvic and perineal pain: Secondary | ICD-10-CM

## 2019-12-17 DIAGNOSIS — E663 Overweight: Secondary | ICD-10-CM | POA: Diagnosis not present

## 2019-12-17 DIAGNOSIS — R7989 Other specified abnormal findings of blood chemistry: Secondary | ICD-10-CM | POA: Diagnosis not present

## 2019-12-17 DIAGNOSIS — Z6828 Body mass index (BMI) 28.0-28.9, adult: Secondary | ICD-10-CM

## 2019-12-17 NOTE — Progress Notes (Signed)
I,Tianna Badgett,acting as a Education administrator for Maximino Greenland, MD.,have documented all relevant documentation on the behalf of Maximino Greenland, MD,as directed by  Maximino Greenland, MD while in the presence of Maximino Greenland, MD.  This visit occurred during the SARS-CoV-2 public health emergency.  Safety protocols were in place, including screening questions prior to the visit, additional usage of staff PPE, and extensive cleaning of exam room while observing appropriate contact time as indicated for disinfecting solutions.  Subjective:     Patient ID: Kristy Walter , female    DOB: 06/02/1970 , 49 y.o.   MRN: [id number]   Chief Complaint  Patient presents with  . Hormones    HPI  She is here today for f/u BHRT therapy. She was started on progesterone cream nightly except Sundays. She feels well on this regimen.      Past Medical History:  Diagnosis Date  . Asthma    no inhaler  . Basal cell carcinoma (BCC) of female breast    left breast  . GERD (gastroesophageal reflux disease)   . Hypothyroidism   . Seasonal allergies   . Thyroid disease      Family History  Problem Relation Age of Onset  . Cancer Mother   . Lung cancer Mother   . Liver cancer Mother   . Cervical cancer Sister   . Allergic rhinitis Brother   . Allergic rhinitis Son   . Diabetes Father      Current Outpatient Medications:  .  cetirizine (ZYRTEC) 10 MG tablet, Take 10 mg by mouth daily., Disp: , Rfl:  .  dexlansoprazole (DEXILANT) 60 MG capsule, TAKE 1 CAPSULE BY MOUTH EVERY DAY, Disp: 90 capsule, Rfl: 1 .  fluticasone (FLONASE) 50 MCG/ACT nasal spray, Place into both nostrils daily., Disp: , Rfl:  .  levothyroxine (SYNTHROID) 100 MCG tablet, Take 1 tablet (100 mcg total) by mouth daily before breakfast. Monday - Thursday, Disp: 90 tablet, Rfl: 1 .  montelukast (SINGULAIR) 10 MG tablet, TAKE ONE TABLET ONCE DAILY AS DIRECTED., Disp: 90 tablet, Rfl: 1 .  SYNTHROID 112 MCG tablet, TAKE 1 TABLET BY MOUTH ON  FRIDAY, SATURDAY, SUNDAY, Disp: 36 tablet, Rfl: 0   Allergies  Allergen Reactions  . Sulfa Antibiotics Anaphylaxis    Pt states she can not breath  . Keflex [Cephalexin]   . Latex Hives  . Other Nausea And Vomiting    Bananas and Egg whites  . Sulfamethoxazole   . Sulfanilamide   . Trimethoprim      Review of Systems  Constitutional: Negative.   Respiratory: Negative.   Cardiovascular: Negative.   Gastrointestinal: Negative.   Neurological: Negative.      Today's Vitals   12/17/19 1552  BP: 120/62  Pulse: 80  Temp: 98 F (36.7 C)  TempSrc: Oral  Weight: 174 lb (78.9 kg)  Height: 5\' 5"  (1.651 m)   Body mass index is 28.96 kg/m.   Objective:  Physical Exam      Assessment And Plan:     1. Female climacteric state Comments: I went over her saliva test results in full detail. She had nl estrone, nl estradiol, low estriol levels which resulted in low estrogen quotient. Additionally, she has elevated progesterone levels, but in actuality they are nl due to supplementation (this was not properly documented on requisition form). She has normal Pg/E2 ratio. Therefore, I believe her progesterone supplementation is adequate. Her testosterone levels are quite elevated, with slightly low DHEA levels.  I do not think DHEA supplementation is warranted. I am concerned that her elevated testosterone levels are due to cross-contamination with her husband's topical testosterone. I will check serum level as well. She agrees to have BHRT f/u in four months.   2. Elevated testosterone level in female Comments: Her testosterone level is quite elevated on saliva test. I will check serum levels today. Her husband does use topical testosterone, encouraged to avoid contact with area that this is applied. Children and women should avoid contact with the unwashed or unclothed area where the testosterone gel has been applied. If she does accidentally get this medicine on the skin, wash the area with  soap and water as soon as possible. - Testosterone, Total  3. Pelvic pain Comments: Initially, I plan to schedule her for pelvic/TV ultrasound. She has had hysterectomy, but states she still has her ovaries. She has yet to discuss her symptoms with her GYN.   4. Overweight with body mass index (BMI) of 28 to 28.9 in adult She was congratulated for maintenance of her weight loss.  She is encouraged to strive for BMI less than 26 to decrease cardiac risk. Advised to continue with her regular exercise routine.  Wt Readings from Last 3 Encounters:  12/17/19 174 lb (78.9 kg)  10/15/19 174 lb (78.9 kg)  01/10/19 188 lb 12.8 oz (85.6 kg)      Patient was given opportunity to ask questions. Patient verbalized understanding of the plan and was able to repeat key elements of the plan. All questions were answered to their satisfaction.  Maximino Greenland, MD   I, Maximino Greenland, MD, have reviewed all documentation for this visit. The documentation on 12/30/19 for the exam, diagnosis, procedures, and orders are all accurate and complete.  THE PATIENT IS ENCOURAGED TO PRACTICE SOCIAL DISTANCING DUE TO THE COVID-19 PANDEMIC.

## 2019-12-17 NOTE — Patient Instructions (Signed)
Menopause and Hormone Replacement Therapy Menopause is a normal time of life when menstrual periods stop completely and the ovaries stop producing the female hormones estrogen and progesterone. This lack of hormones can affect your health and cause undesirable symptoms. Hormone replacement therapy (HRT) can relieve some of those symptoms. What is hormone replacement therapy? HRT is the use of artificial (synthetic) hormones to replace hormones that your body has stopped producing because you have reached menopause. What are my options for HRT?  HRT may consist of the synthetic hormones estrogen and progestin, or it may consist of only estrogen (estrogen-only therapy). You and your health care provider will decide which form of HRT is best for you. If you choose to be on HRT and you have a uterus, estrogen and progestin are usually prescribed. Estrogen-only therapy is used for women who do not have a uterus. Possible options for taking HRT include:  Pills.  Patches.  Gels.  Sprays.  Vaginal cream.  Vaginal rings.  Vaginal inserts. The amount of hormone(s) that you take and how long you take the hormone(s) varies according to your health. It is important to:  Begin HRT with the lowest possible dosage.  Stop HRT as soon as your health care provider tells you to stop.  Work with your health care provider so that you feel informed and comfortable with your decisions. What are the benefits of HRT? HRT can reduce the frequency and severity of menopausal symptoms. Benefits of HRT vary according to the kind of symptoms that you have, how severe they are, and your overall health. HRT may help to improve the following symptoms of menopause:  Hot flashes and night sweats. These are sudden feelings of heat that spread over the face and body. The skin may turn red, like a blush. Night sweats are hot flashes that happen while you are sleeping or trying to sleep.  Bone loss (osteoporosis). The  body loses calcium more quickly after menopause, causing the bones to become weaker. This can increase the risk for bone breaks (fractures).  Vaginal dryness. The lining of the vagina can become thin and dry, which can cause pain during sex or cause infection, burning, or itching.  Urinary tract infections.  Urinary incontinence. This is the inability to control when you pass urine.  Irritability.  Short-term memory problems. What are the risks of HRT? Risks of HRT vary depending on your individual health and medical history. Risks of HRT also depend on whether you receive both estrogen and progestin or you receive estrogen only. HRT may increase the risk of:  Spotting. This is when a small amount of blood leaks from the vagina unexpectedly.  Endometrial cancer. This cancer is in the lining of the uterus (endometrium).  Breast cancer.  Increased density of breast tissue. This can make it harder to find breast cancer on a breast X-ray (mammogram).  Stroke.  Heart disease.  Blood clots.  Gallbladder disease.  Liver disease. Risks of HRT can increase if you have any of the following conditions:  Endometrial cancer.  Liver disease.  Heart disease.  Breast cancer.  History of blood clots.  History of stroke. Follow these instructions at home:  Take over-the-counter and prescription medicines only as told by your health care provider.  Get mammograms, pelvic exams, and medical checkups as often as told by your health care provider.  Have Pap tests done as often as told by your health care provider. A Pap test is sometimes called a Pap smear. It   is a screening test that is used to check for signs of cancer of the cervix and vagina. A Pap test can also identify the presence of infection or precancerous changes. Pap tests may be done: ? Every 3 years, starting at age 21. ? Every 5 years, starting after age 30, in combination with testing for human papillomavirus  (HPV). ? More often or less often depending on other medical conditions you have, your age, and other risk factors.  It is up to you to get the results of your Pap test. Ask your health care provider, or the department that is doing the test, when your results will be ready.  Keep all follow-up visits as told by your health care provider. This is important. Contact a health care provider if you have:  Pain or swelling in your legs.  Shortness of breath.  Chest pain.  Lumps or changes in your breasts or armpits.  Slurred speech.  Pain, burning, or bleeding when you urinate.  Unusual vaginal bleeding.  Dizziness or headaches.  Weakness or numbness in any part of your arms or legs.  Pain in your abdomen. Summary  Menopause is a normal time of life when menstrual periods stop completely and the ovaries stop producing the female hormones estrogen and progesterone.  Hormone replacement therapy (HRT) can relieve some of the symptoms of menopause.  HRT can reduce the frequency and severity of menopausal symptoms.  Risks of HRT vary depending on your individual health and medical history. This information is not intended to replace advice given to you by your health care provider. Make sure you discuss any questions you have with your health care provider. Document Revised: 11/15/2017 Document Reviewed: 11/15/2017 Elsevier Patient Education  2020 [employer] Inc.  

## 2019-12-18 LAB — TESTOSTERONE: Testosterone: 5 ng/dL (ref 4–50)

## 2019-12-25 ENCOUNTER — Encounter: Payer: Self-pay | Admitting: Internal Medicine

## 2020-02-13 ENCOUNTER — Other Ambulatory Visit: Payer: Self-pay

## 2020-02-13 ENCOUNTER — Other Ambulatory Visit: Payer: 59

## 2020-02-13 DIAGNOSIS — R7309 Other abnormal glucose: Secondary | ICD-10-CM

## 2020-02-14 LAB — HEMOGLOBIN A1C
Est. average glucose Bld gHb Est-mCnc: 117 mg/dL
Hgb A1c MFr Bld: 5.7 % — ABNORMAL HIGH (ref 4.8–5.6)

## 2020-02-14 LAB — INSULIN, RANDOM: INSULIN: 16.1 u[IU]/mL (ref 2.6–24.9)

## 2020-03-07 LAB — HM MAMMOGRAPHY

## 2020-03-10 ENCOUNTER — Other Ambulatory Visit: Payer: Self-pay | Admitting: Internal Medicine

## 2020-03-11 ENCOUNTER — Encounter: Payer: Self-pay | Admitting: Internal Medicine

## 2020-04-21 ENCOUNTER — Ambulatory Visit (INDEPENDENT_AMBULATORY_CARE_PROVIDER_SITE_OTHER): Payer: 59 | Admitting: Internal Medicine

## 2020-04-21 ENCOUNTER — Other Ambulatory Visit: Payer: Self-pay

## 2020-04-21 ENCOUNTER — Encounter: Payer: Self-pay | Admitting: Internal Medicine

## 2020-04-21 VITALS — BP 112/64 | HR 67 | Temp 97.8°F | Ht 65.0 in | Wt 176.0 lb

## 2020-04-21 DIAGNOSIS — E890 Postprocedural hypothyroidism: Secondary | ICD-10-CM

## 2020-04-21 DIAGNOSIS — Z23 Encounter for immunization: Secondary | ICD-10-CM | POA: Diagnosis not present

## 2020-04-21 DIAGNOSIS — R7309 Other abnormal glucose: Secondary | ICD-10-CM

## 2020-04-21 DIAGNOSIS — N951 Menopausal and female climacteric states: Secondary | ICD-10-CM

## 2020-04-21 DIAGNOSIS — R1319 Other dysphagia: Secondary | ICD-10-CM | POA: Diagnosis not present

## 2020-04-21 DIAGNOSIS — Z6829 Body mass index (BMI) 29.0-29.9, adult: Secondary | ICD-10-CM

## 2020-04-21 DIAGNOSIS — E663 Overweight: Secondary | ICD-10-CM

## 2020-04-21 DIAGNOSIS — Z1211 Encounter for screening for malignant neoplasm of colon: Secondary | ICD-10-CM

## 2020-04-21 NOTE — Patient Instructions (Signed)
Hypothyroidism  Hypothyroidism is when the thyroid gland does not make enough of certain hormones (it is underactive). The thyroid gland is a small gland located in the lower front part of the neck, just in front of the windpipe (trachea). This gland makes hormones that help control how the body uses food for energy (metabolism) as well as how the heart and brain function. These hormones also play a role in keeping your bones strong. When the thyroid is underactive, it produces too little of the hormones thyroxine (T4) and triiodothyronine (T3). What are the causes? This condition may be caused by:  Hashimoto's disease. This is a disease in which the body's disease-fighting system (immune system) attacks the thyroid gland. This is the most common cause.  Viral infections.  Pregnancy.  Certain medicines.  Birth defects.  Past radiation treatments to the head or neck for cancer.  Past treatment with radioactive iodine.  Past exposure to radiation in the environment.  Past surgical removal of part or all of the thyroid.  Problems with a gland in the center of the brain (pituitary gland).  Lack of enough iodine in the diet. What increases the risk? You are more likely to develop this condition if:  You are female.  You have a family history of thyroid conditions.  You use a medicine called lithium.  You take medicines that affect the immune system (immunosuppressants). What are the signs or symptoms? Symptoms of this condition include:  Feeling as though you have no energy (lethargy).  Not being able to tolerate cold.  Weight gain that is not explained by a change in diet or exercise habits.  Lack of appetite.  Dry skin.  Coarse hair.  Menstrual irregularity.  Slowing of thought processes.  Constipation.  Sadness or depression. How is this diagnosed? This condition may be diagnosed based on:  Your symptoms, your medical history, and a physical exam.  Blood  tests. You may also have imaging tests, such as an ultrasound or MRI. How is this treated? This condition is treated with medicine that replaces the thyroid hormones that your body does not make. After you begin treatment, it may take several weeks for symptoms to go away. Follow these instructions at home:  Take over-the-counter and prescription medicines only as told by your health care provider.  If you start taking any new medicines, tell your health care provider.  Keep all follow-up visits as told by your health care provider. This is important. ? As your condition improves, your dosage of thyroid hormone medicine may change. ? You will need to have blood tests regularly so that your health care provider can monitor your condition. Contact a health care provider if:  Your symptoms do not get better with treatment.  You are taking thyroid hormone replacement medicine and you: ? Sweat a lot. ? Have tremors. ? Feel anxious. ? Lose weight rapidly. ? Cannot tolerate heat. ? Have emotional swings. ? Have diarrhea. ? Feel weak. Get help right away if you have:  Chest pain.  An irregular heartbeat.  A rapid heartbeat.  Difficulty breathing. Summary  Hypothyroidism is when the thyroid gland does not make enough of certain hormones (it is underactive).  When the thyroid is underactive, it produces too little of the hormones thyroxine (T4) and triiodothyronine (T3).  The most common cause is Hashimoto's disease, a disease in which the body's disease-fighting system (immune system) attacks the thyroid gland. The condition can also be caused by viral infections, medicine, pregnancy, or   past radiation treatment to the head or neck.  Symptoms may include weight gain, dry skin, constipation, feeling as though you do not have energy, and not being able to tolerate cold.  This condition is treated with medicine to replace the thyroid hormones that your body does not make. This  information is not intended to replace advice given to you by your health care provider. Make sure you discuss any questions you have with your health care provider. Document Revised: 12/14/2019 Document Reviewed: 11/29/2019 Elsevier Patient Education  2021 [employer].  

## 2020-04-21 NOTE — Progress Notes (Signed)
I,Katawbba Wiggins,acting as a Education administrator for Maximino Greenland, MD.,have documented all relevant documentation on the behalf of Maximino Greenland, MD,as directed by  Maximino Greenland, MD while in the presence of Maximino Greenland, MD. b  This visit occurred during the SARS-CoV-2 public health emergency.  Safety protocols were in place, including screening questions prior to the visit, additional usage of staff PPE, and extensive cleaning of exam room while observing appropriate contact time as indicated for disinfecting solutions.  Subjective:     Patient ID: Kristy Walter , female    DOB: [birth year] , 50 y.o.   MRN: [id number]   Chief Complaint  Patient presents with  . Hypothyroidism    HPI  The patient is here today for a follow-up on her thyroid and hormones. She reports compliance with meds.  Thyroid Problem Presents for follow-up visit. Patient reports no cold intolerance, constipation, depressed mood or diaphoresis. The symptoms have been stable.     Past Medical History:  Diagnosis Date  . Asthma    no inhaler  . Basal cell carcinoma (BCC) of female breast    left breast  . GERD (gastroesophageal reflux disease)   . Hypothyroidism   . Seasonal allergies   . Thyroid disease      Family History  Problem Relation Age of Onset  . Cancer Mother   . Lung cancer Mother   . Liver cancer Mother   . Cervical cancer Sister   . Allergic rhinitis Brother   . Allergic rhinitis Son   . Diabetes Father      Current Outpatient Medications:  .  cetirizine (ZYRTEC) 10 MG tablet, Take 10 mg by mouth daily., Disp: , Rfl:  .  dexlansoprazole (DEXILANT) 60 MG capsule, TAKE 1 CAPSULE BY MOUTH EVERY DAY, Disp: 90 capsule, Rfl: 1 .  levothyroxine (SYNTHROID) 100 MCG tablet, Take 1 tablet (100 mcg total) by mouth daily before breakfast. Monday - Thursday (Patient taking differently: Take 100 mcg by mouth. Monday - Thursday), Disp: 90 tablet, Rfl: 1 .  montelukast (SINGULAIR) 10 MG tablet,  TAKE ONE TABLET ONCE DAILY AS DIRECTED., Disp: 90 tablet, Rfl: 1 .  SYNTHROID 112 MCG tablet, TAKE 1 TABLET BY MOUTH ON FRIDAY, SATURDAY, SUNDAY, Disp: 36 tablet, Rfl: 0   Allergies  Allergen Reactions  . Sulfa Antibiotics Anaphylaxis    Pt states she can not breath  . Keflex [Cephalexin]   . Latex Hives  . Other Nausea And Vomiting    Bananas and Egg whites  . Sulfamethoxazole   . Sulfanilamide   . Trimethoprim      Review of Systems  Constitutional: Negative.  Negative for diaphoresis.  Respiratory: Negative.   Cardiovascular: Negative.   Gastrointestinal: Negative.  Negative for constipation.       She c/o difficulty swallowing. Feels like food gets stuck in her throat. Denies pain w/ swallowing. No other GI sx.   Endocrine: Negative for cold intolerance.  Psychiatric/Behavioral: Negative.   All other systems reviewed and are negative.    Today's Vitals   04/21/20 1151  BP: 112/64  Pulse: 67  Temp: 97.8 F (36.6 C)  TempSrc: Oral  Weight: 176 lb (79.8 kg)  Height: 5' 5"  (1.651 m)   Body mass index is 29.29 kg/m.   Objective:  Physical Exam Vitals and nursing note reviewed.  Constitutional:      Appearance: Normal appearance. She is obese.  HENT:     Head: Normocephalic and atraumatic.  Cardiovascular:  Rate and Rhythm: Normal rate and regular rhythm.     Heart sounds: Normal heart sounds.  Pulmonary:     Breath sounds: Normal breath sounds.  Skin:    General: Skin is warm.  Neurological:     General: No focal deficit present.     Mental Status: She is alert and oriented to person, place, and time.         Assessment And Plan:     1. Postablative hypothyroidism Comments: I will check thyroid panel and forward results to Dr. Chalmers Cater for her review.  - TSH - T4, free  2. Other dysphagia Comments: I will refer her to GI for possible EGD. Another possibility is thyroid nodule/cyst is impacting her sx, I will check thyroid u/s. - US THYROID;  Future  3. Other abnormal glucose Comments: Her a1c has been elevated in the past, I will recheck this today. Encouraged to limit her intake of sugary beverages.  - Hemoglobin A1c - BMP8+EGFR  4. Female climacteric state Comments: Her sx are stable with BHRT.   5. Overweight with body mass index (BMI) of 29 to 29.9 in adult Comments: She is encouraged to aim for at least 150 minutes of exercise per week.   6. Immunization due - Tdap vaccine greater than or equal to 7yo IM  7. Colon cancer screening Comments: I will refer her to GI for CRC screening.  - Ambulatory referral to Gastroenterology  Patient was given opportunity to ask questions. Patient verbalized understanding of the plan and was able to repeat key elements of the plan. All questions were answered to their satisfaction.  Maximino Greenland, MD   I, Maximino Greenland, MD, have reviewed all documentation for this visit. The documentation on 05/01/20 for the exam, diagnosis, procedures, and orders are all accurate and complete.  THE PATIENT IS ENCOURAGED TO PRACTICE SOCIAL DISTANCING DUE TO THE COVID-19 PANDEMIC.

## 2020-04-22 LAB — BMP8+EGFR
BUN/Creatinine Ratio: 15 (ref 9–23)
BUN: 11 mg/dL (ref 6–24)
CO2: 21 mmol/L (ref 20–29)
Calcium: 10.1 mg/dL (ref 8.7–10.2)
Chloride: 101 mmol/L (ref 96–106)
Creatinine, Ser: 0.74 mg/dL (ref 0.57–1.00)
GFR calc Af Amer: 109 mL/min/{1.73_m2} (ref >59)
GFR calc non Af Amer: 95 mL/min/{1.73_m2} (ref >59)
Glucose: 92 mg/dL (ref 65–99)
Potassium: 4.2 mmol/L (ref 3.5–5.2)
Sodium: 141 mmol/L (ref 134–144)

## 2020-04-22 LAB — TSH: TSH: 1.06 u[IU]/mL (ref 0.450–4.500)

## 2020-04-22 LAB — T4, FREE: Free T4: 1.45 ng/dL (ref 0.82–1.77)

## 2020-04-22 LAB — HEMOGLOBIN A1C
Est. average glucose Bld gHb Est-mCnc: 117 mg/dL
Hgb A1c MFr Bld: 5.7 % — ABNORMAL HIGH (ref 4.8–5.6)

## 2020-05-02 ENCOUNTER — Ambulatory Visit
Admission: RE | Admit: 2020-05-02 | Discharge: 2020-05-02 | Disposition: A | Discharge status: Home / Self Care | Payer: 59 | Source: Ambulatory Visit | Attending: Internal Medicine | Admitting: Internal Medicine

## 2020-05-02 DIAGNOSIS — R1319 Other dysphagia: Secondary | ICD-10-CM

## 2020-05-21 ENCOUNTER — Other Ambulatory Visit: Payer: Self-pay

## 2020-05-21 MED ORDER — DEXLANSOPRAZOLE 60 MG PO CPDR: DELAYED_RELEASE_CAPSULE | ORAL | 1 refills | Status: DC

## 2020-07-20 ENCOUNTER — Other Ambulatory Visit: Payer: Self-pay | Admitting: Internal Medicine

## 2020-08-08 ENCOUNTER — Encounter: Payer: Self-pay | Admitting: Internal Medicine

## 2020-08-14 ENCOUNTER — Telehealth (INDEPENDENT_AMBULATORY_CARE_PROVIDER_SITE_OTHER): Payer: 59 | Admitting: Nurse Practitioner

## 2020-08-14 ENCOUNTER — Telehealth: Payer: Self-pay

## 2020-08-14 DIAGNOSIS — R059 Cough, unspecified: Secondary | ICD-10-CM

## 2020-08-14 DIAGNOSIS — J329 Chronic sinusitis, unspecified: Secondary | ICD-10-CM

## 2020-08-14 MED ORDER — AZITHROMYCIN 250 MG PO TABS: ORAL_TABLET | ORAL | 0 refills | Status: AC

## 2020-08-14 MED ORDER — DOXYCYCLINE MONOHYDRATE 100 MG PO CAPS
100.0000 mg | ORAL_CAPSULE | Freq: Two times a day (BID) | ORAL | 0 refills | Status: DC
Start: 2020-08-14 — End: 2020-08-14

## 2020-08-14 NOTE — Addendum Note (Signed)
Addended by: Melba Coon on: 08/14/2020 06:28 PM   Modules accepted: Orders

## 2020-08-14 NOTE — Patient Instructions (Signed)
How to Perform a Sinus Rinse A sinus rinse is a home treatment. It rinses your sinuses with a mixture of salt and water (saline solution). Sinuses are air-filled spaces in your skull behind the bones of your face and forehead. They open into your nasal cavity. A sinus rinse can help to clear your nasal cavity. It can clear mucus, dirt, dust, or pollen. You may do a sinus rinse when you have:  A cold.  A virus.  Allergies.  A sinus infection.  A stuffy nose. Talk with your doctor about whether a sinus rinse might help you. What are the risks? A sinus rinse is normally very safe and helpful. However, there are a few risks. These include:  A burning feeling in the sinuses. This may happen if you do not make the saline solution as instructed. Be sure to follow all directions when making the saline solution.  Nasal irritation.  Infection from unclean water. This is rare, but possible. Do not do a sinus rinse if you have had:  Ear or nasal surgery.  An ear infection.  Blocked ears. Supplies needed:  Saline solution or powder.  Distilled or germ-free (sterile) water may be needed to mix with saline powder. ? You may use boiled and cooled tap water. Boil tap water for 5 minutes; cool until it is lukewarm. Use within 24 hours. ? Do not use regular tap water to mix with the saline solution.  Neti pot or nasal rinse bottle. This releases the saline solution into your nose and through your sinuses. You can buy neti pots and rinse bottles: ? At your local pharmacy. ? At a health food store. ? Online. How to perform a sinus rinse 1. Wash your hands with soap and water. 2. Wash your device using the directions that came with it. 3. Dry your device. 4. Use the solution that comes with your device or one that is sold separately in stores. Follow the mixing directions on the package if you need to mix with sterile or distilled water. 5. Fill your device with the amount of saline solution  stated in the device instructions. 6. Stand over a sink and tilt your head sideways over the sink. 7. Place the spout of the device in your upper nostril (the one closer to the ceiling). 8. Gently pour or squeeze the saline solution into your nasal cavity. The liquid should drain to your lower nostril if you are not too stuffed up (congested). 9. While rinsing, breathe through your open mouth. 10. Gently blow your nose to clear any mucus and rinse solution. Blowing too hard may cause ear pain. 11. Repeat in your other nostril. 12. Clean and rinse your device with clean water. 13. Air-dry your device. Talk with your doctor or pharmacist if you have questions about how to do a sinus rinse.   Summary  A sinus rinse is a home treatment. It rinses your sinuses with a mixture of salt and water (saline solution).  A sinus rinse is normally very safe and helpful. Follow all instructions carefully.  Talk with your doctor about whether a sinus rinse might help you. This information is not intended to replace advice given to you by your health care provider. Make sure you discuss any questions you have with your health care provider. Document Revised: 12/25/2019 Document Reviewed: 12/25/2019 Elsevier Patient Education  2021 Elsevier Inc.  

## 2020-08-14 NOTE — Telephone Encounter (Signed)
The pt was scheduled a virtual visit for evaluation of  Positive covid test.

## 2020-08-14 NOTE — Progress Notes (Addendum)
Virtual Visit via telehealth   This visit type was conducted due to national recommendations for restrictions regarding the COVID-19 Pandemic (e.g. social distancing) in an effort to limit this patient's exposure and mitigate transmission in our community.  Due to her co-morbid illnesses, this patient is at least at moderate risk for complications without adequate follow up.  This format is felt to be most appropriate for this patient at this time.  All issues noted in this document were discussed and addressed.  A limited physical exam was performed with this format.    This visit type was conducted due to national recommendations for restrictions regarding the COVID-19 Pandemic (e.g. social distancing) in an effort to limit this patient's exposure and mitigate transmission in our community.  Patients identity confirmed using two different identifiers.  This format is felt to be most appropriate for this patient at this time.  All issues noted in this document were discussed and addressed.  No physical exam was performed (except for noted visual exam findings with Video Visits).    Date:  08/14/2020   ID:  Kristy Walter, DOB 30-Jun-1970, MRN [id number]  Patient Location:  Home   Provider location:   Office    Chief Complaint:  Cough, nasal congestion   History of Present Illness:    Kristy Walter is a 50 y.o. female who presents via video conferencing for a telehealth visit today.   The patient has have symptoms concerning for COVID-19 infection cough and nasal congestion  She was positive for COVID on Friday.  She has taken osillocecum, zinc and vitamin D.  She is having sinus infection. She has had congestion. Headache. Sinuses are tender. No fever. Cough here and there. Bodyache, back ache. She is going to [place] next week and would like to make sure she does not have any symptoms by then.     Past Medical History:  Diagnosis Date  . Asthma    no inhaler  . Basal cell  carcinoma (BCC) of female breast    left breast  . GERD (gastroesophageal reflux disease)   . Hypothyroidism   . Seasonal allergies   . Thyroid disease    Past Surgical History:  Procedure Laterality Date  . AUGMENTATION MAMMAPLASTY    . BILATERAL SALPINGECTOMY Bilateral 06/15/2012   Procedure: BILATERAL SALPINGECTOMY;  Surgeon: Lovenia Kim, MD;  Location: [facility];  Service: Gynecology;  Laterality: Bilateral;  BILATERAL SALPINGECTOMY  . BREAST ENHANCEMENT SURGERY    . CHOLECYSTECTOMY    . CLAVICLE SURGERY    . MASS EXCISION Left 11/03/2017   Procedure: EXCISION OF LEFT BREAST SKIN CANCER;  Surgeon: Wallace Going, DO;  Location: [facility];  Service: Plastics;  Laterality: Left;  . NASAL SINUS SURGERY    . ROBOTIC ASSISTED TOTAL HYSTERECTOMY N/A 06/15/2012   Procedure: ROBOTIC ASSISTED TOTAL HYSTERECTOMY;  Surgeon: Lovenia Kim, MD;  Location: [facility];  Service: Gynecology;  Laterality: N/A;  ROBOTIC ASSISTED TOTAL HYSTERECTOMY  . Small tummy tuck     July 22nd 2019 at [facility]  . TONSILLECTOMY       No outpatient medications have been marked as taking for the 08/14/20 encounter (Appointment) with Bary Castilla, NP.     Allergies:   Sulfa antibiotics, Keflex [cephalexin], Latex, Other, Sulfamethoxazole, Sulfanilamide, and Trimethoprim   Social History   Tobacco Use  . Smoking status: Never Smoker  . Smokeless tobacco: Never Used  Vaping Use  . Vaping Use: Never  used  Substance Use Topics  . Alcohol use: Yes    Comment: rarely  . Drug use: No     Family Hx: The patient's family history includes Allergic rhinitis in her brother and son; Cancer in her mother; Cervical cancer in her sister; Diabetes in her father; Liver cancer in her mother; Lung cancer in her mother.  ROS:   Please see the history of present illness.    Review of Systems  Constitutional: Negative for chills and fever.  HENT: Positive for congestion and sinus pain.    Respiratory: Positive for cough. Negative for wheezing.   Musculoskeletal: Negative for back pain and myalgias.  Neurological: Positive for headaches.    All other systems reviewed and are negative.   Labs/Other Tests and Data Reviewed:    Recent Labs: 10/15/2019: ALT 13; Hemoglobin 13.5; Platelets 376 04/21/2020: BUN 11; Creatinine, Ser 0.74; Potassium 4.2; Sodium 141; TSH 1.060   Recent Lipid Panel Lab Results  Component Value Date/Time   CHOL 226 (H) 10/15/2019 12:33 PM   TRIG 136 10/15/2019 12:33 PM   HDL 51 10/15/2019 12:33 PM   CHOLHDL 4.4 10/15/2019 12:33 PM   LDLCALC 151 (H) 10/15/2019 12:33 PM    Wt Readings from Last 3 Encounters:  04/21/20 176 lb (79.8 kg)  12/17/19 174 lb (78.9 kg)  10/15/19 174 lb (78.9 kg)     Exam:    Vital Signs:  LMP 05/31/2012     Physical Exam Vitals and nursing note reviewed.  Constitutional:      Appearance: Normal appearance.  HENT:     Head: Normocephalic and atraumatic.  Cardiovascular:     Heart sounds: No murmur heard.   Pulmonary:     Effort: Pulmonary effort is normal.  Neurological:     Mental Status: She is alert and oriented to person, place, and time.  Psychiatric:        Mood and Affect: Affect normal.     ASSESSMENT & PLAN:   1) Cough  Patient is educated to use OTC cough medicine to help with cough symptoms.   2) Recurrent Sinusitis  Patient has had recurrent episode of sinusitis. She has tried OTC antihistamines and regimens. Patient is allergic to many antx. Including keflex and sulfa. Will send a prescription for zpak. As patient requested she is traveling out of country and wants to be on the safe-side. Patient is instructed to read the SE of the medication and is aware of the SE and risks. Patient verbalized understanding.   Side effects and appropriate use of all the medication(s) were discussed with the patient today. Patient advised to use the medication(s) as directed by their healthcare provider.  The patient was encouraged to read, review, and understand all associated package inserts and contact our office with any questions or concerns. The patient accepts the risks of the treatment plan and had an opportunity to ask questions.   The patient was encouraged to call or send a message through [facility] for any questions or concerns.   Follow up: if symptoms persist or do not get better.   Advised patient to take Vitamin C, D, Zinc.  Keep yourself hydrated with a lot of water and rest. Take Delsym for cough and Mucinex. Take Tylenol or pain reliever every 4-6 hours as needed for pain/fever/body ache. Educated patient if symptoms get worse or if she experiences any SOB or pain in her legs to seek immediate emergency care. Continue to monitor your pulse oxygen. Call us if  you have any questions. Quarantine for 5 days if tested positive and no symptoms or 10 days if tested positive and have symptoms. Wear a mask around other people.   There are no diagnoses linked to this encounter.   COVID-19 Education: The signs and symptoms of COVID-19 were discussed with the patient and how to seek care for testing (follow up with PCP or arrange E-visit).  The importance of social distancing was discussed today.  Patient Risk:   After full review of this patients clinical status, I feel that they are at least moderate risk at this time.  Time:   Today, I have spent 15  minutes/ seconds with the patient with telehealth technology discussing above diagnoses.     Medication Adjustments/Labs and Tests Ordered: Current medicines are reviewed at length with the patient today.  Concerns regarding medicines are outlined above.   Tests Ordered: No orders of the defined types were placed in this encounter.   Medication Changes: No orders of the defined types were placed in this encounter.   Disposition:  Follow up if symptoms worsen or do not get better.   Signed, Bary Castilla, NP

## 2020-09-16 ENCOUNTER — Encounter: Payer: Self-pay | Admitting: Internal Medicine

## 2020-09-16 ENCOUNTER — Other Ambulatory Visit: Payer: Self-pay | Admitting: Internal Medicine

## 2020-10-15 ENCOUNTER — Other Ambulatory Visit: Payer: Self-pay | Admitting: Internal Medicine

## 2020-10-20 ENCOUNTER — Other Ambulatory Visit: Payer: Self-pay

## 2020-10-20 ENCOUNTER — Encounter: Payer: Self-pay | Admitting: Internal Medicine

## 2020-10-20 ENCOUNTER — Encounter: Payer: 59 | Admitting: Internal Medicine

## 2020-10-20 MED ORDER — DEXLANSOPRAZOLE 60 MG PO CPDR: 60.0000 mg | DELAYED_RELEASE_CAPSULE | Freq: Every day | ORAL | 1 refills | Status: DC

## 2020-10-21 ENCOUNTER — Encounter: Payer: 59 | Admitting: Internal Medicine

## 2020-10-27 ENCOUNTER — Encounter: Payer: 59 | Admitting: Internal Medicine

## 2020-11-03 ENCOUNTER — Encounter: Payer: Self-pay | Admitting: Internal Medicine

## 2020-11-03 ENCOUNTER — Ambulatory Visit (INDEPENDENT_AMBULATORY_CARE_PROVIDER_SITE_OTHER): Payer: 59 | Admitting: Internal Medicine

## 2020-11-03 ENCOUNTER — Other Ambulatory Visit: Payer: Self-pay

## 2020-11-03 VITALS — BP 118/80 | HR 76 | Temp 98.6°F | Ht 65.0 in | Wt 176.2 lb

## 2020-11-03 DIAGNOSIS — R0683 Snoring: Secondary | ICD-10-CM

## 2020-11-03 DIAGNOSIS — E890 Postprocedural hypothyroidism: Secondary | ICD-10-CM

## 2020-11-03 DIAGNOSIS — Z23 Encounter for immunization: Secondary | ICD-10-CM

## 2020-11-03 DIAGNOSIS — R7309 Other abnormal glucose: Secondary | ICD-10-CM

## 2020-11-03 DIAGNOSIS — Z0000 Encounter for general adult medical examination without abnormal findings: Secondary | ICD-10-CM | POA: Diagnosis not present

## 2020-11-03 DIAGNOSIS — Z1211 Encounter for screening for malignant neoplasm of colon: Secondary | ICD-10-CM

## 2020-11-03 DIAGNOSIS — F5101 Primary insomnia: Secondary | ICD-10-CM | POA: Diagnosis not present

## 2020-11-03 DIAGNOSIS — J309 Allergic rhinitis, unspecified: Secondary | ICD-10-CM

## 2020-11-03 DIAGNOSIS — R490 Dysphonia: Secondary | ICD-10-CM

## 2020-11-03 DIAGNOSIS — R0982 Postnasal drip: Secondary | ICD-10-CM

## 2020-11-03 NOTE — Patient Instructions (Signed)
Health Maintenance, Female Adopting a healthy lifestyle and getting preventive care are important in promoting health and wellness. Ask your health care provider about: The right schedule for you to have regular tests and exams. Things you can do on your own to prevent diseases and keep yourself healthy. What should I know about diet, weight, and exercise? Eat a healthy diet  Eat a diet that includes plenty of vegetables, fruits, low-fat dairy products, and lean protein. Do not eat a lot of foods that are high in solid fats, added sugars, or sodium.  Maintain a healthy weight Body mass index (BMI) is used to identify weight problems. It estimates body fat based on height and weight. Your health care provider can help determineyour BMI and help you achieve or maintain a healthy weight. Get regular exercise Get regular exercise. This is one of the most important things you can do for your health. Most adults should: Exercise for at least 150 minutes each week. The exercise should increase your heart rate and make you sweat (moderate-intensity exercise). Do strengthening exercises at least twice a week. This is in addition to the moderate-intensity exercise. Spend less time sitting. Even light physical activity can be beneficial. Watch cholesterol and blood lipids Have your blood tested for lipids and cholesterol at 51 years of age, then havethis test every 5 years. Have your cholesterol levels checked more often if: Your lipid or cholesterol levels are high. You are older than 51 years of age. You are at high risk for heart disease. What should I know about cancer screening? Depending on your health history and family history, you may need to have cancer screening at various ages. This may include screening for: Breast cancer. Cervical cancer. Colorectal cancer. Skin cancer. Lung cancer. What should I know about heart disease, diabetes, and high blood pressure? Blood pressure and heart  disease High blood pressure causes heart disease and increases the risk of stroke. This is more likely to develop in people who have high blood pressure readings, are of African descent, or are overweight. Have your blood pressure checked: Every 3-5 years if you are 18-39 years of age. Every year if you are 40 years old or older. Diabetes Have regular diabetes screenings. This checks your fasting blood sugar level. Have the screening done: Once every three years after age 40 if you are at a normal weight and have a low risk for diabetes. More often and at a younger age if you are overweight or have a high risk for diabetes. What should I know about preventing infection? Hepatitis B If you have a higher risk for hepatitis B, you should be screened for this virus. Talk with your health care provider to find out if you are at risk forhepatitis B infection. Hepatitis C Testing is recommended for: Everyone born from 1945 through 1965. Anyone with known risk factors for hepatitis C. Sexually transmitted infections (STIs) Get screened for STIs, including gonorrhea and chlamydia, if: You are sexually active and are younger than 51 years of age. You are older than 51 years of age and your health care provider tells you that you are at risk for this type of infection. Your sexual activity has changed since you were last screened, and you are at increased risk for chlamydia or gonorrhea. Ask your health care provider if you are at risk. Ask your health care provider about whether you are at high risk for HIV. Your health care provider may recommend a prescription medicine to help   prevent HIV infection. If you choose to take medicine to prevent HIV, you should first get tested for HIV. You should then be tested every 3 months for as long as you are taking the medicine. Pregnancy If you are about to stop having your period (premenopausal) and you may become pregnant, seek counseling before you get  pregnant. Take 400 to 800 micrograms (mcg) of folic acid every day if you become pregnant. Ask for birth control (contraception) if you want to prevent pregnancy. Osteoporosis and menopause Osteoporosis is a disease in which the bones lose minerals and strength with aging. This can result in bone fractures. If you are 65 years old or older, or if you are at risk for osteoporosis and fractures, ask your health care provider if you should: Be screened for bone loss. Take a calcium or vitamin D supplement to lower your risk of fractures. Be given hormone replacement therapy (HRT) to treat symptoms of menopause. Follow these instructions at home: Lifestyle Do not use any products that contain nicotine or tobacco, such as cigarettes, e-cigarettes, and chewing tobacco. If you need help quitting, ask your health care provider. Do not use street drugs. Do not share needles. Ask your health care provider for help if you need support or information about quitting drugs. Alcohol use Do not drink alcohol if: Your health care provider tells you not to drink. You are pregnant, may be pregnant, or are planning to become pregnant. If you drink alcohol: Limit how much you use to 0-1 drink a day. Limit intake if you are breastfeeding. Be aware of how much alcohol is in your drink. In the [place] one drink equals one 12 oz bottle of beer (355 mL), one 5 oz glass of wine (148 mL), or one 1 oz glass of hard liquor (44 mL). General instructions Schedule regular health, dental, and eye exams. Stay current with your vaccines. Tell your health care provider if: You often feel depressed. You have ever been abused or do not feel safe at home. Summary Adopting a healthy lifestyle and getting preventive care are important in promoting health and wellness. Follow your health care provider's instructions about healthy diet, exercising, and getting tested or screened for diseases. Follow your health care provider's  instructions on monitoring your cholesterol and blood pressure. This information is not intended to replace advice given to you by your health care provider. Make sure you discuss any questions you have with your healthcare provider. Document Revised: 03/08/2018 Document Reviewed: 03/08/2018 Elsevier Patient Education  2022 Elsevier Inc.  

## 2020-11-03 NOTE — Progress Notes (Signed)
I,Yamilka [employer] as a Education administrator for Kristy Greenland, MD.,have documented all relevant documentation on the behalf of Kristy Greenland, MD,as directed by  Kristy Greenland, MD while in the presence of Kristy Greenland, MD.  This visit occurred during the SARS-CoV-2 public health emergency.  Safety protocols were in place, including screening questions prior to the visit, additional usage of staff PPE, and extensive cleaning of exam room while observing appropriate contact time as indicated for disinfecting solutions.  Subjective:     Patient ID: Kristy Walter , female    DOB: May 27, 1970 , 50 y.o.   MRN: [id number]   Chief Complaint  Patient presents with   Annual Exam     HPI  Patient here for HM.  She is followed by GYN for her pelvic exams. She is followed by [facility].     Past Medical History:  Diagnosis Date   Asthma    no inhaler   Basal cell carcinoma (BCC) of female breast    left breast   GERD (gastroesophageal reflux disease)    Hypothyroidism    Seasonal allergies    Thyroid disease      Family History  Problem Relation Age of Onset   Cancer Mother    Lung cancer Mother    Liver cancer Mother    Cervical cancer Sister    Allergic rhinitis Brother    Allergic rhinitis Son    Diabetes Father      Current Outpatient Medications:    cetirizine (ZYRTEC) 10 MG tablet, Take 10 mg by mouth daily., Disp: , Rfl:    dexlansoprazole (DEXILANT) 60 MG capsule, Take 1 capsule (60 mg total) by mouth daily., Disp: 30 capsule, Rfl: 1   levothyroxine (SYNTHROID) 100 MCG tablet, Take 1 tablet (100 mcg total) by mouth daily before breakfast. Monday - Thursday (Patient taking differently: Take 100 mcg by mouth. Monday - Thursday), Disp: 90 tablet, Rfl: 1   montelukast (SINGULAIR) 10 MG tablet, TAKE ONE TABLET ONCE DAILY AS DIRECTED., Disp: 90 tablet, Rfl: 1   SYNTHROID 112 MCG tablet, TAKE 1 TABLET BY MOUTH ON FRIDAY, SATURDAY, SUNDAY, Disp: 36 tablet, Rfl: 0    azithromycin (ZITHROMAX) 250 MG tablet, Take 1 tablet (250 mg total) by mouth daily. Take first 2 tablets together, then 1 every day until finished., Disp: 6 tablet, Rfl: 0   benzonatate (TESSALON) 100 MG capsule, Take 1 capsule (100 mg total) by mouth 3 (three) times daily as needed., Disp: 30 capsule, Rfl: 0   phentermine 30 MG capsule, Take 30 mg by mouth daily., Disp: , Rfl:    predniSONE (DELTASONE) 10 MG tablet, Take 4 tablets (40 mg total) by mouth daily for 4 days., Disp: 16 tablet, Rfl: 0   Vitamin D, Ergocalciferol, (DRISDOL) 1.25 MG (50000 UNIT) CAPS capsule, Take 50,000 Units by mouth once a week., Disp: , Rfl:    Allergies  Allergen Reactions   Keflex [Cephalexin] Anaphylaxis    She swells and get hives everywhere   Sulfa Antibiotics Anaphylaxis    Pt states she can not breath   Sulfamethoxazole Anaphylaxis   Trimethoprim Anaphylaxis   Latex Hives   Soy Allergy Other (See Comments)    Upset stomach   Other Nausea And Vomiting    Bananas and Egg whites      The patient states she uses none for birth control. Last LMP was Patient's last menstrual period was 05/31/2012.. Negative for Dysmenorrhea. Negative for: breast discharge, breast lump(s), breast  pain and breast self exam. Associated symptoms include abnormal vaginal bleeding. Pertinent negatives include abnormal bleeding (hematology), anxiety, decreased libido, depression, difficulty falling sleep, dyspareunia, history of infertility, nocturia, sexual dysfunction, sleep disturbances, urinary incontinence, urinary urgency, vaginal discharge and vaginal itching. Diet regular.The patient states her exercise level is  moderate.  . The patient's tobacco use is:  Social History   Tobacco Use  Smoking Status Never  Smokeless Tobacco Never  . She has been exposed to passive smoke. The patient's alcohol use is:  Social History   Substance and Sexual Activity  Alcohol Use Yes   Comment: rarely    Review of Systems   Constitutional: Negative.   HENT:  Positive for postnasal drip.   Eyes: Negative.   Respiratory: Negative.    Cardiovascular: Negative.   Gastrointestinal: Negative.   Endocrine: Negative.   Genitourinary: Negative.   Musculoskeletal: Negative.   Skin: Negative.   Allergic/Immunologic: Negative.   Neurological: Negative.   Hematological: Negative.   Psychiatric/Behavioral: Negative.      Today's Vitals   11/03/20 1202  BP: 118/80  Pulse: 76  Temp: 98.6 F (37 C)  Weight: 176 lb 3.2 oz (79.9 kg)  Height: 5' 5"  (1.651 m)  PainSc: 0-No pain   Body mass index is 29.32 kg/m.  Wt Readings from Last 3 Encounters:  11/03/20 176 lb 3.2 oz (79.9 kg)  04/21/20 176 lb (79.8 kg)  12/17/19 174 lb (78.9 kg)     Objective:  Physical Exam Vitals and nursing note reviewed.  Constitutional:      Appearance: Normal appearance.  HENT:     Head: Normocephalic and atraumatic.     Right Ear: Tympanic membrane, ear canal and external ear normal.     Left Ear: Tympanic membrane, ear canal and external ear normal.     Nose:     Comments: Masked     Mouth/Throat:     Comments: Masked  Eyes:     Extraocular Movements: Extraocular movements intact.     Conjunctiva/sclera: Conjunctivae normal.     Pupils: Pupils are equal, round, and reactive to light.  Cardiovascular:     Rate and Rhythm: Normal rate and regular rhythm.     Pulses: Normal pulses.     Heart sounds: Normal heart sounds.  Pulmonary:     Effort: Pulmonary effort is normal.     Breath sounds: Normal breath sounds.  Chest:  Breasts:    Tanner Score is 5.     Comments: She kept bra on for exam.  Abdominal:     General: Abdomen is flat. Bowel sounds are normal.     Palpations: Abdomen is soft.  Genitourinary:    Comments: deferred Musculoskeletal:        General: Normal range of motion.     Cervical back: Normal range of motion and neck supple.  Skin:    General: Skin is warm and dry.  Neurological:     General:  No focal deficit present.     Mental Status: She is alert and oriented to person, place, and time.  Psychiatric:        Mood and Affect: Mood normal.        Behavior: Behavior normal.        Assessment And Plan:     1. Encounter for general adult medical examination w/o abnormal findings Comments: A full exam was performed. She is already in touch with Dr. Lorie Apley office to schedule repeat colonoscopy. PATIENT IS ADVISED TO GET  30-45 MINUTES REGULAR EXERCISE NO LESS THAN FOUR TO FIVE DAYS PER WEEK - BOTH WEIGHTBEARING EXERCISES AND AEROBIC ARE RECOMMENDED.  PATIENT IS ADVISED TO FOLLOW A HEALTHY DIET WITH AT LEAST SIX FRUITS/VEGGIES PER DAY, DECREASE INTAKE OF RED MEAT, AND TO INCREASE FISH INTAKE TO TWO DAYS PER WEEK.  MEATS/FISH SHOULD NOT BE FRIED, BAKED OR BROILED IS PREFERABLE.  IT IS ALSO IMPORTANT TO CUT BACK ON YOUR SUGAR INTAKE. PLEASE AVOID ANYTHING WITH ADDED SUGAR, CORN SYRUP OR OTHER SWEETENERS. IF YOU MUST USE A SWEETENER, YOU CAN TRY STEVIA. IT IS ALSO IMPORTANT TO AVOID ARTIFICIALLY SWEETENERS AND DIET BEVERAGES. LASTLY, I SUGGEST WEARING SPF 50 SUNSCREEN ON EXPOSED PARTS AND ESPECIALLY WHEN IN THE DIRECT SUNLIGHT FOR AN EXTENDED PERIOD OF TIME.  PLEASE AVOID FAST FOOD RESTAURANTS AND INCREASE YOUR WATER INTAKE.  - CMP14+EGFR - CBC - Lipid panel  2. Postablative hypothyroidism Comments: I will check thyroid panel and adjust meds as needed. I will forward a copy of her lab results to Dr. Chalmers Cater, her endocrinologist.  - TSH - T4, Free - T3, free - Thyroglobulin antibody  3. Other abnormal glucose Comments: Her a1c has been elevated in the past. I will recheck an a1c today for further evaluation. She is encouraged to limit her intake of sweetened beverages.  - Hemoglobin A1c - Insulin, random(561)  4. Primary insomnia Comments: I will switch her to progesterone capsules, 6m nightly. Rx will be called into [facility]. She agrees to rto in six weeks for  re-evaluation.  5. Allergic rhinitis with postnasal drip Comments: Advised to cut out dairy for two weeks. I will refer her for formal allergy testing.  She is in agreement with her treatment plan.  - Ambulatory referral to Allergy  6. Snoring Comments: Also w/ non-restorative sleep, nocturia, vivid dreams. She has heard herself snoring. Will consider Neuro eval for sleep study evaluation.   7. Hoarseness of voice Comments: I will refer her to ENT for further evaluation and examination of vocal cords. Sx could be due to PND. She has had neg ENT findings in recent past.  - Ambulatory referral to ENT  8. Colon cancer screening Comments: I will have referral coordinator check with GI to ensure appt for CRC screening has been scheduled.  - Ambulatory referral to Gastroenterology  Patient was given opportunity to ask questions. Patient verbalized understanding of the plan and was able to repeat key elements of the plan. All questions were answered to their satisfaction.   I, RMaximino Greenland MD, have reviewed all documentation for this visit. The documentation on 11/09/20 for the exam, diagnosis, procedures, and orders are all accurate and complete.  THE PATIENT IS ENCOURAGED TO PRACTICE SOCIAL DISTANCING DUE TO THE COVID-19 PANDEMIC.

## 2020-11-04 LAB — CMP14+EGFR
ALT: 13 IU/L (ref 0–32)
AST: 21 IU/L (ref 0–40)
Albumin/Globulin Ratio: 2.1 (ref 1.2–2.2)
Albumin: 5.2 g/dL — ABNORMAL HIGH (ref 3.8–4.8)
Alkaline Phosphatase: 92 IU/L (ref 44–121)
BUN/Creatinine Ratio: 11 (ref 9–23)
BUN: 9 mg/dL (ref 6–24)
Bilirubin Total: 0.4 mg/dL (ref 0.0–1.2)
CO2: 23 mmol/L (ref 20–29)
Calcium: 10.3 mg/dL — ABNORMAL HIGH (ref 8.7–10.2)
Chloride: 101 mmol/L (ref 96–106)
Creatinine, Ser: 0.8 mg/dL (ref 0.57–1.00)
Globulin, Total: 2.5 g/dL (ref 1.5–4.5)
Glucose: 89 mg/dL (ref 65–99)
Potassium: 4.2 mmol/L (ref 3.5–5.2)
Sodium: 142 mmol/L (ref 134–144)
Total Protein: 7.7 g/dL (ref 6.0–8.5)
eGFR: 90 mL/min/1.73

## 2020-11-04 LAB — THYROGLOBULIN ANTIBODY: Thyroglobulin Antibody: <1 IU/mL (ref 0.0–0.9)

## 2020-11-04 LAB — CBC
Hematocrit: 40.3 % (ref 34.0–46.6)
Hemoglobin: 13.7 g/dL (ref 11.1–15.9)
MCH: 29.7 pg (ref 26.6–33.0)
MCHC: 34 g/dL (ref 31.5–35.7)
MCV: 87 fL (ref 79–97)
Platelets: 386 10*3/uL (ref 150–450)
RBC: 4.61 x10E6/uL (ref 3.77–5.28)
RDW: 12.8 % (ref 11.7–15.4)
WBC: 6.7 10*3/uL (ref 3.4–10.8)

## 2020-11-04 LAB — INSULIN, RANDOM: INSULIN: 9.8 u[IU]/mL (ref 2.6–24.9)

## 2020-11-04 LAB — HEMOGLOBIN A1C
Est. average glucose Bld gHb Est-mCnc: 117 mg/dL
Hgb A1c MFr Bld: 5.7 % — ABNORMAL HIGH (ref 4.8–5.6)

## 2020-11-04 LAB — T3, FREE: T3, Free: 3.3 pg/mL (ref 2.0–4.4)

## 2020-11-04 LAB — TSH: TSH: 0.142 u[IU]/mL — ABNORMAL LOW (ref 0.450–4.500)

## 2020-11-04 LAB — T4, FREE: Free T4: 1.63 ng/dL (ref 0.82–1.77)

## 2020-11-05 ENCOUNTER — Other Ambulatory Visit: Payer: Self-pay

## 2020-11-05 ENCOUNTER — Encounter: Payer: Self-pay | Admitting: Internal Medicine

## 2020-11-05 ENCOUNTER — Encounter (HOSPITAL_BASED_OUTPATIENT_CLINIC_OR_DEPARTMENT_OTHER): Payer: Self-pay | Admitting: Obstetrics and Gynecology

## 2020-11-05 ENCOUNTER — Emergency Department (HOSPITAL_BASED_OUTPATIENT_CLINIC_OR_DEPARTMENT_OTHER): Payer: 59 | Admitting: Radiology

## 2020-11-05 ENCOUNTER — Telehealth: Payer: 59 | Admitting: Physician Assistant

## 2020-11-05 ENCOUNTER — Emergency Department (HOSPITAL_BASED_OUTPATIENT_CLINIC_OR_DEPARTMENT_OTHER)
Admission: EM | Admit: 2020-11-05 | Discharge: 2020-11-06 | Disposition: A | Discharge status: Home / Self Care | Payer: 59 | Attending: Emergency Medicine | Admitting: Emergency Medicine

## 2020-11-05 DIAGNOSIS — R079 Chest pain, unspecified: Secondary | ICD-10-CM | POA: Diagnosis present

## 2020-11-05 DIAGNOSIS — J40 Bronchitis, not specified as acute or chronic: Secondary | ICD-10-CM | POA: Diagnosis not present

## 2020-11-05 DIAGNOSIS — J45909 Unspecified asthma, uncomplicated: Secondary | ICD-10-CM | POA: Insufficient documentation

## 2020-11-05 DIAGNOSIS — J208 Acute bronchitis due to other specified organisms: Secondary | ICD-10-CM | POA: Diagnosis not present

## 2020-11-05 DIAGNOSIS — Z853 Personal history of malignant neoplasm of breast: Secondary | ICD-10-CM | POA: Insufficient documentation

## 2020-11-05 DIAGNOSIS — Z20822 Contact with and (suspected) exposure to covid-19: Secondary | ICD-10-CM | POA: Insufficient documentation

## 2020-11-05 DIAGNOSIS — R059 Cough, unspecified: Secondary | ICD-10-CM

## 2020-11-05 DIAGNOSIS — Z79899 Other long term (current) drug therapy: Secondary | ICD-10-CM | POA: Diagnosis not present

## 2020-11-05 DIAGNOSIS — E039 Hypothyroidism, unspecified: Secondary | ICD-10-CM | POA: Diagnosis not present

## 2020-11-05 DIAGNOSIS — Z91040 Latex allergy status: Secondary | ICD-10-CM | POA: Diagnosis not present

## 2020-11-05 LAB — BASIC METABOLIC PANEL
Anion gap: 11 (ref 5–15)
BUN: 14 mg/dL (ref 6–20)
CO2: 24 mmol/L (ref 22–32)
Calcium: 9.2 mg/dL (ref 8.9–10.3)
Chloride: 101 mmol/L (ref 98–111)
Creatinine, Ser: 0.88 mg/dL (ref 0.44–1.00)
GFR, Estimated: >60 mL/min (ref >60)
Glucose, Bld: 100 mg/dL — ABNORMAL HIGH (ref 70–99)
Potassium: 3.7 mmol/L (ref 3.5–5.1)
Sodium: 136 mmol/L (ref 135–145)

## 2020-11-05 LAB — CBC
HCT: 38.8 % (ref 36.0–46.0)
Hemoglobin: 13 g/dL (ref 12.0–15.0)
MCH: 29.1 pg (ref 26.0–34.0)
MCHC: 33.5 g/dL (ref 30.0–36.0)
MCV: 86.8 fL (ref 80.0–100.0)
Platelets: 362 10*3/uL (ref 150–400)
RBC: 4.47 MIL/uL (ref 3.87–5.11)
RDW: 12.1 % (ref 11.5–15.5)
WBC: 6 10*3/uL (ref 4.0–10.5)
nRBC: 0 % (ref 0.0–0.2)

## 2020-11-05 LAB — RESP PANEL BY RT-PCR (FLU A&B, COVID) ARPGX2
Influenza A by PCR: NEGATIVE
Influenza B by PCR: NEGATIVE
SARS Coronavirus 2 by RT PCR: NEGATIVE

## 2020-11-05 LAB — TROPONIN I (HIGH SENSITIVITY)
Troponin I (High Sensitivity): 2 ng/L (ref <18)
Troponin I (High Sensitivity): 3 ng/L (ref <18)

## 2020-11-05 MED ORDER — PREDNISONE 10 MG (21) PO TBPK: ORAL_TABLET | ORAL | 0 refills | Status: DC

## 2020-11-05 MED ORDER — BENZONATATE 100 MG PO CAPS: 100.0000 mg | ORAL_CAPSULE | Freq: Three times a day (TID) | ORAL | 0 refills | Status: DC | PRN

## 2020-11-05 NOTE — ED Triage Notes (Signed)
Patient reports to the ER for chest congestion and cough. Patient reports she has pressure in her chest from the possible cold. Patient reports she is concerned for pneumonia. Patient reports negative home COVID test

## 2020-11-05 NOTE — Progress Notes (Signed)
Duplicate evisit 

## 2020-11-05 NOTE — Progress Notes (Signed)
We are sorry that you are not feeling well.  Here is how we plan to help!  Based on your presentation I believe you most likely have A cough due to a virus.  This is called viral bronchitis and is best treated by rest, plenty of fluids and control of the cough.  You may use Ibuprofen or Tylenol as directed to help your symptoms.     In addition you may use A prescription cough medication called Tessalon Perles 136m. You may take 1-2 capsules every 8 hours as needed for your cough.  Prednisone 10 mg daily for 6 days (see taper instructions below)  Directions for 6 day taper: Day 1: 2 tablets before breakfast, 1 after both lunch & dinner and 2 at bedtime Day 2: 1 tab before breakfast, 1 after both lunch & dinner and 2 at bedtime Day 3: 1 tab at each meal & 1 at bedtime Day 4: 1 tab at breakfast, 1 at lunch, 1 at bedtime Day 5: 1 tab at breakfast & 1 tab at bedtime Day 6: 1 tab at breakfast  From your responses in the eVisit questionnaire you describe inflammation in the upper respiratory tract which is causing a significant cough.  This is commonly called Bronchitis and has four common causes:   Allergies Viral Infections Acid Reflux Bacterial Infection Allergies, viruses and acid reflux are treated by controlling symptoms or eliminating the cause. An example might be a cough caused by taking certain blood pressure medications. You stop the cough by changing the medication. Another example might be a cough caused by acid reflux. Controlling the reflux helps control the cough.  USE OF BRONCHODILATOR ("RESCUE") INHALERS: There is a risk from using your bronchodilator too frequently.  The risk is that over-reliance on a medication which only relaxes the muscles surrounding the breathing tubes can reduce the effectiveness of medications prescribed to reduce swelling and congestion of the tubes themselves.  Although you feel brief relief from the bronchodilator inhaler, your asthma may actually be  worsening with the tubes becoming more swollen and filled with mucus.  This can delay other crucial treatments, such as oral steroid medications. If you need to use a bronchodilator inhaler daily, several times per day, you should discuss this with your provider.  There are probably better treatments that could be used to keep your asthma under control.     HOME CARE Only take medications as instructed by your medical team. Complete the entire course of an antibiotic. Drink plenty of fluids and get plenty of rest. Avoid close contacts especially the very young and the elderly Cover your mouth if you cough or cough into your sleeve. Always remember to wash your hands A steam or ultrasonic humidifier can help congestion.   GET HELP RIGHT AWAY IF: You develop worsening fever. You become short of breath You cough up blood. Your symptoms persist after you have completed your treatment plan MAKE SURE YOU  Understand these instructions. Will watch your condition. Will get help right away if you are not doing well or get worse.    Thank you for choosing an e-visit.  Your e-visit answers were reviewed by a board certified advanced clinical practitioner to complete your personal care plan. Depending upon the condition, your plan could have included both over the counter or prescription medications.  Please review your pharmacy choice. Make sure the pharmacy is open so you can pick up prescription now. If there is a problem, you may contact your provider  through [employer] and have the prescription routed to another pharmacy.  Your safety is important to [place]. If you have drug allergies check your prescription carefully.   For the next 24 hours you can use MyChart to ask questions about today's visit, request a non-urgent call back, or ask for a work or school excuse. You will get an email in the next two days asking about your experience. I hope that your e-visit has been valuable and will  speed your recovery.  I provided 6 minutes of non face-to-face time during this encounter for chart review and documentation.

## 2020-11-06 MED ORDER — PREDNISONE 10 MG PO TABS: 40.0000 mg | ORAL_TABLET | Freq: Every day | ORAL | 0 refills | Status: AC

## 2020-11-06 MED ORDER — AZITHROMYCIN 250 MG PO TABS: 250.0000 mg | ORAL_TABLET | Freq: Every day | ORAL | 0 refills | Status: DC

## 2020-11-06 NOTE — ED Provider Notes (Signed)
[facility] EMERGENCY DEPT Provider Note   CSN: [id number] Arrival date & time: 11/05/20  1946     History Chief Complaint  Patient presents with   URI    Kristy Walter is a 50 y.o. female.  HPI     Since May has struggled with congestion, taking tylenol cold and natural remedies Had hx of prior problems, had surgery with Dr. Janace Hoard and had been good for a while  For last several weeks having chest pain, worried about pneumonia/bronchitis Last 3 days coughing up a spongy mucus, yellow-green Raspy voice in May, worse fourth of July, phlegm in throat Chronic congestion since May, but has had years since had symptoms like this Hurts across chest like an infection, across front of chest, tightness, nothing makes it better or worse Has had some dyspnea in the past few days due to congestion.  If working out in plank position more mucus comes out.  Cp worse later at night, not worse with cough.  Cough has been ongoing for a few weeks, worsened over the weekend, clearing more mucus Asthma diagnosis but describes more allergies and has never used inhaler No known sick contacts, works in Facilities manager office   Past Medical History:  Diagnosis Date   Asthma    no inhaler   Basal cell carcinoma (BCC) of female breast    left breast   GERD (gastroesophageal reflux disease)    Hypothyroidism    Seasonal allergies    Thyroid disease     Patient Active Problem List   Diagnosis Date Noted   Primary hypothyroidism 03/08/2018   Palpitations 03/08/2018   Female climacteric state 03/08/2018   Menopausal symptoms 11/16/2017   Polydipsia 11/16/2017   Status post complete hysterectomy 06/15/2012    Past Surgical History:  Procedure Laterality Date   AUGMENTATION MAMMAPLASTY     BILATERAL SALPINGECTOMY Bilateral 06/15/2012   Procedure: BILATERAL SALPINGECTOMY;  Surgeon: Lovenia Kim, MD;  Location: [facility];  Service: Gynecology;  Laterality: Bilateral;  BILATERAL  SALPINGECTOMY   BREAST ENHANCEMENT SURGERY     CHOLECYSTECTOMY     CLAVICLE SURGERY     MASS EXCISION Left 11/03/2017   Procedure: EXCISION OF LEFT BREAST SKIN CANCER;  Surgeon: Wallace Going, DO;  Location: [facility];  Service: Plastics;  Laterality: Left;   NASAL SINUS SURGERY     ROBOTIC ASSISTED TOTAL HYSTERECTOMY N/A 06/15/2012   Procedure: ROBOTIC ASSISTED TOTAL HYSTERECTOMY;  Surgeon: Lovenia Kim, MD;  Location: [facility];  Service: Gynecology;  Laterality: N/A;  ROBOTIC ASSISTED TOTAL HYSTERECTOMY   Small tummy tuck     July 22nd 2019 at [facility]       OB History     Gravida      Para      Term      Preterm      AB      Living  2      SAB      IAB      Ectopic      Multiple      Live Births              Family History  Problem Relation Age of Onset   Cancer Mother    Lung cancer Mother    Liver cancer Mother    Cervical cancer Sister    Allergic rhinitis Brother    Allergic rhinitis Son    Diabetes Father  Social History   Tobacco Use   Smoking status: Never   Smokeless tobacco: Never  Vaping Use   Vaping Use: Never used  Substance Use Topics   Alcohol use: Yes    Comment: rarely   Drug use: No    Home Medications Prior to Admission medications   Medication Sig Start Date End Date Taking? Authorizing Provider  azithromycin (ZITHROMAX) 250 MG tablet Take 1 tablet (250 mg total) by mouth daily. Take first 2 tablets together, then 1 every day until finished. 11/06/20  Yes Gareth Morgan, MD  predniSONE (DELTASONE) 10 MG tablet Take 4 tablets (40 mg total) by mouth daily for 4 days. 11/06/20 11/10/20 Yes Gareth Morgan, MD  benzonatate (TESSALON) 100 MG capsule Take 1 capsule (100 mg total) by mouth 3 (three) times daily as needed. 11/05/20   Mar Daring, PA-C  cetirizine (ZYRTEC) 10 MG tablet Take 10 mg by mouth daily.    [provider]  dexlansoprazole (DEXILANT) 60 MG  capsule Take 1 capsule (60 mg total) by mouth daily. 10/20/20   Glendale Chard, MD  levothyroxine (SYNTHROID) 100 MCG tablet Take 1 tablet (100 mcg total) by mouth daily before breakfast. Monday - Thursday Patient taking differently: Take 100 mcg by mouth. Monday - Thursday 10/18/19   Glendale Chard, MD  montelukast (SINGULAIR) 10 MG tablet TAKE ONE TABLET ONCE DAILY AS DIRECTED. 07/22/20   Glendale Chard, MD  phentermine 30 MG capsule Take 30 mg by mouth daily. 10/20/20   [provider]  SYNTHROID 112 MCG tablet TAKE 1 TABLET BY MOUTH ON Marshia Ly 03/27/19   Glendale Chard, MD  Vitamin D, Ergocalciferol, (DRISDOL) 1.25 MG (50000 UNIT) CAPS capsule Take 50,000 Units by mouth once a week. 10/19/20   [provider]    Allergies    Keflex [cephalexin], Sulfa antibiotics, Sulfamethoxazole, Trimethoprim, Latex, Soy allergy, and Other  Review of Systems   Review of Systems  Constitutional:  Positive for fatigue. Negative for appetite change and fever.  HENT:  Positive for congestion, rhinorrhea and sore throat. Negative for ear pain (a few weeks ago resolved), sinus pressure and sinus pain.   Respiratory:  Positive for cough and shortness of breath.   Cardiovascular:  Positive for chest pain. Negative for leg swelling.  Gastrointestinal:  Positive for nausea. Negative for abdominal pain, diarrhea and vomiting.  Musculoskeletal:  Negative for arthralgias.  Skin:  Negative for rash.  Neurological:  Positive for light-headedness. Negative for headaches.   Physical Exam Updated Vital Signs BP (!) 127/92 (BP Location: Right Arm)   Pulse 95   Temp 98 F (36.7 C) (Oral)   Resp 18   LMP 05/31/2012   SpO2 99%   Physical Exam Vitals and nursing note reviewed.  Constitutional:      General: She is not in acute distress.    Appearance: She is well-developed. She is not diaphoretic.     Comments: Frequent coughing  HENT:     Head: Normocephalic and atraumatic.   Eyes:     Conjunctiva/sclera: Conjunctivae normal.  Cardiovascular:     Rate and Rhythm: Normal rate and regular rhythm.     Heart sounds: Normal heart sounds. No murmur heard.   No friction rub. No gallop.  Pulmonary:     Effort: Pulmonary effort is normal. No respiratory distress.     Breath sounds: Normal breath sounds. No wheezing or rales.  Abdominal:     General: There is no distension.     Palpations:  Abdomen is soft.     Tenderness: There is no abdominal tenderness. There is no guarding.  Musculoskeletal:        General: No tenderness.     Cervical back: Normal range of motion.  Skin:    General: Skin is warm and dry.     Findings: No erythema or rash.  Neurological:     Mental Status: She is alert and oriented to person, place, and time.    ED Results / Procedures / Treatments   Labs (all labs ordered are listed, but only abnormal results are displayed) Labs Reviewed  BASIC METABOLIC PANEL - Abnormal; Notable for the following components:      Result Value   Glucose, Bld 100 (*)    All other components within normal limits  RESP PANEL BY RT-PCR (FLU A&B, COVID) ARPGX2  CBC  TROPONIN I (HIGH SENSITIVITY)  TROPONIN I (HIGH SENSITIVITY)    EKG None  Radiology DG Chest 2 View  Result Date: 11/05/2020 CLINICAL DATA:  Chest pain EXAM: CHEST - 2 VIEW COMPARISON:  05/03/2016 FINDINGS: The heart size and mediastinal contours are within normal limits. Both lungs are clear. The visualized skeletal structures are unremarkable. IMPRESSION: No active cardiopulmonary disease. Electronically Signed   By: Donavan Foil M.D.   On: 11/05/2020 20:33    Procedures Procedures   Medications Ordered in ED Medications - No data to display  ED Course  I have reviewed the triage vital signs and the nursing notes.  Pertinent labs & imaging results that were available during my care of the patient were reviewed by me and considered in my medical decision making (see chart for  details).    MDM Rules/Calculators/A&P                           49yo female with history above presents with concern for worsening cough, chest congestion and chest pain.    EKG without significant abnormalities. CXR without pneumonia.   COVID/flu testing negative. No anemia, no leukocytosis.  Troponin negative x2, doubt ACS.  Given significant productive cough associated with the discomfort, bronchitis most likely, have low suspicion for PE.  Discussed recommendation for steroids given prior diagnosis of asthma and persistent cough, she declines but will think about it.  She is requesting antibiotics, feel given duration of symptoms, productive cough, bacterial bronchitis or occult pneumonia is possible and given rx for azithromycin. Recommend follow up with ENT given hoarseness and possibility also that cough is due to laryngeal pathology.  Patient discharged in stable condition with understanding of reasons to return.   Final Clinical Impression(s) / ED Diagnoses Final diagnoses:  Cough  Bronchitis    Rx / DC Orders ED Discharge Orders          Ordered    predniSONE (DELTASONE) 10 MG tablet  Daily        11/06/20 0035    azithromycin (ZITHROMAX) 250 MG tablet  Daily        11/06/20 0035             Gareth Morgan, MD 11/06/20 2248

## 2020-11-10 ENCOUNTER — Telehealth: Payer: Self-pay

## 2020-11-10 ENCOUNTER — Encounter: Payer: Self-pay | Admitting: Internal Medicine

## 2020-11-10 NOTE — Telephone Encounter (Signed)
error 

## 2020-11-11 LAB — LIPID PANEL
Chol/HDL Ratio: 4.6 ratio — ABNORMAL HIGH (ref 0.0–4.4)
Cholesterol, Total: 239 mg/dL — ABNORMAL HIGH (ref 100–199)
HDL: 52 mg/dL (ref >39)
LDL Chol Calc (NIH): 165 mg/dL — ABNORMAL HIGH (ref 0–99)
Triglycerides: 123 mg/dL (ref 0–149)
VLDL Cholesterol Cal: 22 mg/dL (ref 5–40)

## 2020-11-11 LAB — SPECIMEN STATUS REPORT

## 2020-11-14 ENCOUNTER — Telehealth: Payer: Self-pay

## 2020-11-14 NOTE — Telephone Encounter (Signed)
Left the patient a message to call back for lab results or view mychart.

## 2020-11-14 NOTE — Telephone Encounter (Signed)
-----   Message from Glendale Chard, MD sent at 11/11/2020  9:39 PM EDT ----- Your LDL, bad cholesterol is 165 - higher than last year. I have included your cardiac risk score as listed below. Due to your family history of heart disease, would you like Cardiology referral or do you want to discuss further at your next visit?  The 10-year ASCVD risk score Mikey Bussing DC Brooke Bonito., et al., 2013) is: 1.7%   Values used to calculate the score:     Age: 50 years     Sex: Female     Is Non-Hispanic African American: No     Diabetic: No     Tobacco smoker: No     Systolic Blood Pressure: [id number] mmHg     Is BP treated: No     HDL Cholesterol: 52 mg/dL     Total Cholesterol: 239 mg/dL

## 2020-11-20 ENCOUNTER — Other Ambulatory Visit: Payer: Self-pay | Admitting: Internal Medicine

## 2020-11-20 DIAGNOSIS — R0683 Snoring: Secondary | ICD-10-CM

## 2020-11-20 DIAGNOSIS — Z8249 Family history of ischemic heart disease and other diseases of the circulatory system: Secondary | ICD-10-CM

## 2020-11-26 ENCOUNTER — Encounter: Payer: Self-pay | Admitting: Internal Medicine

## 2020-11-26 ENCOUNTER — Other Ambulatory Visit: Payer: Self-pay | Admitting: Internal Medicine

## 2020-11-27 ENCOUNTER — Encounter: Payer: Self-pay | Admitting: Internal Medicine

## 2020-12-08 ENCOUNTER — Ambulatory Visit: Payer: 59 | Admitting: Internal Medicine

## 2020-12-10 ENCOUNTER — Encounter: Payer: Self-pay | Admitting: Internal Medicine

## 2020-12-12 ENCOUNTER — Other Ambulatory Visit: Payer: Self-pay | Admitting: Internal Medicine

## 2020-12-12 ENCOUNTER — Ambulatory Visit: Payer: 59 | Admitting: Family Medicine

## 2020-12-12 ENCOUNTER — Encounter: Payer: Self-pay | Admitting: Internal Medicine

## 2020-12-15 ENCOUNTER — Institutional Professional Consult (permissible substitution): Payer: 59 | Admitting: Neurology

## 2020-12-15 ENCOUNTER — Ambulatory Visit (INDEPENDENT_AMBULATORY_CARE_PROVIDER_SITE_OTHER): Payer: 59

## 2020-12-15 ENCOUNTER — Ambulatory Visit (INDEPENDENT_AMBULATORY_CARE_PROVIDER_SITE_OTHER): Payer: 59 | Admitting: Podiatry

## 2020-12-15 ENCOUNTER — Other Ambulatory Visit: Payer: Self-pay

## 2020-12-15 DIAGNOSIS — M79671 Pain in right foot: Secondary | ICD-10-CM

## 2020-12-15 DIAGNOSIS — M21619 Bunion of unspecified foot: Secondary | ICD-10-CM | POA: Diagnosis not present

## 2020-12-15 DIAGNOSIS — M779 Enthesopathy, unspecified: Secondary | ICD-10-CM | POA: Diagnosis not present

## 2020-12-15 DIAGNOSIS — M7671 Peroneal tendinitis, right leg: Secondary | ICD-10-CM

## 2020-12-15 NOTE — Patient Instructions (Signed)

## 2020-12-16 ENCOUNTER — Other Ambulatory Visit: Payer: Self-pay | Admitting: Podiatry

## 2020-12-16 DIAGNOSIS — M779 Enthesopathy, unspecified: Secondary | ICD-10-CM

## 2020-12-16 NOTE — Progress Notes (Signed)
Subjective:   Patient ID: Kristy Walter, female   DOB: 50 y.o.   MRN: [id number]   HPI Patient presents with several problems with 1 being inflammation at the base of the fifth metatarsal bone that is painful with swelling and also structural bunion deformity left over right that is been present for a fairly long time getting worse and making shoe gear difficult.  Patient does not smoke likes to be active   Review of Systems  All other systems reviewed and are negative.      Objective:  Physical Exam Vitals and nursing note reviewed.  Constitutional:      Appearance: She is well-developed.  Pulmonary:     Effort: Pulmonary effort is normal.  Musculoskeletal:        General: Normal range of motion.  Skin:    General: Skin is warm.  Neurological:     Mental Status: She is alert.    Neurovascular status intact muscle strength adequate range of motion within normal limits.  Patient is noted to have inflammation pain at the fifth metatarsal base right with fluid buildup around the area with no muscle strength loss and is found to have structural deformity around the first metatarsal head left over right with redness and pain     Assessment:  Peroneal tendinitis acute nature right and also structural bunion deformity failure to respond conservatively to wider shoes soaks left over right     Plan:  H&P x-rays reviewed all conditions discussed.  I educated on bunion deformity at 1 point in the future I do think it needs to be corrected and at this time we will get a focus on the base of the fifth metatarsal and I did sterile prep and I injected the fifth metatarsal base 3 mg Kenalog 5 mg Xylocaine and advised on ice and wider shoes  X-rays indicate moderate structural bunion deformity left over right foot with elevated intermetatarsal angle of approximate 15 degrees

## 2020-12-17 ENCOUNTER — Encounter: Payer: Self-pay | Admitting: Internal Medicine

## 2020-12-18 ENCOUNTER — Ambulatory Visit: Payer: 59 | Admitting: Internal Medicine

## 2020-12-26 ENCOUNTER — Other Ambulatory Visit: Payer: Self-pay | Admitting: Internal Medicine

## 2021-01-15 ENCOUNTER — Ambulatory Visit: Payer: 59 | Admitting: Internal Medicine

## 2021-01-19 ENCOUNTER — Telehealth: Payer: Self-pay | Admitting: Neurology

## 2021-01-19 NOTE — Telephone Encounter (Signed)
Appt changed due to MD in meeting- LVM & sent mychart msg informing pt of change.

## 2021-01-21 ENCOUNTER — Ambulatory Visit: Payer: 59 | Admitting: Cardiology

## 2021-01-21 ENCOUNTER — Ambulatory Visit: Payer: 59 | Admitting: Internal Medicine

## 2021-01-22 ENCOUNTER — Ambulatory Visit: Payer: 59 | Admitting: Internal Medicine

## 2021-01-23 ENCOUNTER — Ambulatory Visit (HOSPITAL_BASED_OUTPATIENT_CLINIC_OR_DEPARTMENT_OTHER): Payer: 59 | Admitting: Cardiovascular Disease

## 2021-01-23 NOTE — Progress Notes (Incomplete)
Cardiology Office Note:    Date:  01/23/2021   ID:  HOLLAND KOTTER, DOB [birth year], MRN [id number]  PCP:  Glendale Chard, MD   [facility] Providers Cardiologist:  None { Click to update primary MD,subspecialty MD or APP then REFRESH:1}    Referring MD: Glendale Chard, MD   No chief complaint on file.   History of Present Illness:    Kristy Walter is a 50 y.o. female with a hx of *** here for  Today,  He/She denies any palpitations, chest pain, or shortness of breath. No lightheadedness, headaches, syncope, orthopnea, or PND. Also has no lower extremity edema or exertional symptoms.   Past Medical History:  Diagnosis Date   Asthma    no inhaler   Basal cell carcinoma (BCC) of female breast    left breast   GERD (gastroesophageal reflux disease)    Hypothyroidism    Seasonal allergies    Thyroid disease     Past Surgical History:  Procedure Laterality Date   AUGMENTATION MAMMAPLASTY     BILATERAL SALPINGECTOMY Bilateral 06/15/2012   Procedure: BILATERAL SALPINGECTOMY;  Surgeon: Lovenia Kim, MD;  Location: [facility];  Service: Gynecology;  Laterality: Bilateral;  BILATERAL SALPINGECTOMY   BREAST ENHANCEMENT SURGERY     CHOLECYSTECTOMY     CLAVICLE SURGERY     MASS EXCISION Left 11/03/2017   Procedure: EXCISION OF LEFT BREAST SKIN CANCER;  Surgeon: Wallace Going, DO;  Location: [place];  Service: Plastics;  Laterality: Left;   NASAL SINUS SURGERY     ROBOTIC ASSISTED TOTAL HYSTERECTOMY N/A 06/15/2012   Procedure: ROBOTIC ASSISTED TOTAL HYSTERECTOMY;  Surgeon: Lovenia Kim, MD;  Location: [facility];  Service: Gynecology;  Laterality: N/A;  ROBOTIC ASSISTED TOTAL HYSTERECTOMY   Small tummy tuck     July 22nd 2019 at [facility]      Current Medications: No outpatient medications have been marked as taking for the 01/23/21 encounter (Appointment) with Skeet Latch, MD.     Allergies:   Keflex [cephalexin],  Sulfa antibiotics, Sulfamethoxazole, Trimethoprim, Latex, Soy allergy, and Other   Social History   Socioeconomic History   Marital status: Married    Spouse name: Not on file   Number of children: Not on file   Years of education: Not on file   Highest education level: Associate degree: occupational, Hotel manager, or vocational program  Occupational History   Not on file  Tobacco Use   Smoking status: Never   Smokeless tobacco: Never  Vaping Use   Vaping Use: Never used  Substance and Sexual Activity   Alcohol use: Yes    Comment: rarely   Drug use: No   Sexual activity: Yes    Birth control/protection: Surgical  Other Topics Concern   Not on file  Social History Narrative   Not on file   Social Determinants of Health   Financial Resource Strain: Low Risk    Difficulty of Paying Living Expenses: Not hard at all  Food Insecurity: No Food Insecurity   Worried About Charity fundraiser in the Last Year: Never true   [place] in the Last Year: Never true  Transportation Needs: No Transportation Needs   Lack of Transportation (Medical): No   Lack of Transportation (Non-Medical): No  Physical Activity: Sufficiently Active   Days of Exercise per Week: 5 days   Minutes of Exercise per Session: 60 min  Stress: No Stress Concern Present  Feeling of Stress : Not at all  Social Connections: Moderately Isolated   Frequency of Communication with Friends and Family: Once a week   Frequency of Social Gatherings with Friends and Family: Once a week   Attends Religious Services: More than 4 times per year   Active Member of [employer] or Organizations: No   Attends Music therapist: Not on file   Marital Status: Married     Family History: The patient's family history includes Allergic rhinitis in her brother and son; Cancer in her mother; Cervical cancer in her sister; Diabetes in her father; Liver cancer in her mother; Lung cancer in her mother.  ROS:   Please  see the history of present illness.    (+) All other systems reviewed and are negative.  EKGs/Labs/Other Studies Reviewed:    The following studies were reviewed today: ***  EKG:    : Sinus ***. Rate *** bpm.  Recent Labs: 11/03/2020: ALT 13; TSH 0.142 11/05/2020: BUN 14; Creatinine, Ser 0.88; Hemoglobin 13.0; Platelets 362; Potassium 3.7; Sodium 136  Recent Lipid Panel    Component Value Date/Time   CHOL 239 (H) 11/03/2020 1303   TRIG 123 11/03/2020 1303   HDL 52 11/03/2020 1303   CHOLHDL 4.6 (H) 11/03/2020 1303   LDLCALC 165 (H) 11/03/2020 1303     Risk Assessment/Calculations:   {Does this patient have ATRIAL FIBRILLATION?[phone]}       Physical Exam:    Wt Readings from Last 3 Encounters:  11/03/20 176 lb 3.2 oz (79.9 kg)  04/21/20 176 lb (79.8 kg)  12/17/19 174 lb (78.9 kg)     VS:  LMP 05/31/2012  , BMI There is no height or weight on file to calculate BMI. GENERAL:  Well appearing HEENT: Pupils equal round and reactive, fundi not visualized, oral mucosa unremarkable NECK:  No jugular venous distention, waveform within normal limits, carotid upstroke brisk and symmetric, no bruits, no thyromegaly LYMPHATICS:  No cervical adenopathy LUNGS:  Clear to auscultation bilaterally HEART:  RRR.  PMI not displaced or sustained,S1 and S2 within normal limits, no S3, no S4, no clicks, no rubs, *** murmurs ABD:  Flat, positive bowel sounds normal in frequency in pitch, no bruits, no rebound, no guarding, no midline pulsatile mass, no hepatomegaly, no splenomegaly EXT:  2 plus pulses throughout, no edema, no cyanosis no clubbing SKIN:  No rashes no nodules NEURO:  Cranial nerves II through XII grossly intact, motor grossly intact throughout PSYCH:  Cognitively intact, oriented to person place and time   ASSESSMENT:    No diagnosis found. PLAN:    No problem-specific Assessment & Plan notes found for this encounter.    ***   {Are you ordering a CV Procedure  (e.g. stress test, cath, DCCV, TEE, etc)?   Press F2        :[id number]}    Disposition: FU with Tiffany C. Oval Linsey, MD, [facility] in ***  Medication Adjustments/Labs and Tests Ordered: Current medicines are reviewed at length with the patient today.  Concerns regarding medicines are outlined above.   No orders of the defined types were placed in this encounter.  No orders of the defined types were placed in this encounter.   There are no Patient Instructions on file for this visit.    I,Mathew Stumpf,acting as a Education administrator for Skeet Latch, MD.,have documented all relevant documentation on the behalf of Skeet Latch, MD,as directed by  Skeet Latch, MD while in the presence of Skeet Latch, MD.   ***  Waynetta Pean  01/23/2021 7:25 AM    [facility]

## 2021-01-27 ENCOUNTER — Institutional Professional Consult (permissible substitution): Payer: 59 | Admitting: Neurology

## 2021-01-30 ENCOUNTER — Institutional Professional Consult (permissible substitution): Payer: 59 | Admitting: Neurology

## 2021-02-04 ENCOUNTER — Ambulatory Visit (HOSPITAL_BASED_OUTPATIENT_CLINIC_OR_DEPARTMENT_OTHER): Payer: 59 | Admitting: Cardiology

## 2021-02-10 ENCOUNTER — Other Ambulatory Visit: Payer: Self-pay

## 2021-02-10 ENCOUNTER — Ambulatory Visit (INDEPENDENT_AMBULATORY_CARE_PROVIDER_SITE_OTHER): Payer: 59 | Admitting: Allergy and Immunology

## 2021-02-10 ENCOUNTER — Encounter: Payer: Self-pay | Admitting: Allergy and Immunology

## 2021-02-10 VITALS — BP 118/70 | HR 78 | Temp 98.5°F | Resp 18 | Ht 67.5 in | Wt 168.6 lb

## 2021-02-10 DIAGNOSIS — K219 Gastro-esophageal reflux disease without esophagitis: Secondary | ICD-10-CM

## 2021-02-10 DIAGNOSIS — J3089 Other allergic rhinitis: Secondary | ICD-10-CM

## 2021-02-10 DIAGNOSIS — H1010 Acute atopic conjunctivitis, unspecified eye: Secondary | ICD-10-CM

## 2021-02-10 DIAGNOSIS — H1013 Acute atopic conjunctivitis, bilateral: Secondary | ICD-10-CM

## 2021-02-10 DIAGNOSIS — J301 Allergic rhinitis due to pollen: Secondary | ICD-10-CM | POA: Diagnosis not present

## 2021-02-10 MED ORDER — OLOPATADINE HCL 0.2 % OP SOLN: 1.0000 [drp] | OPHTHALMIC | 5 refills | Status: DC

## 2021-02-10 MED ORDER — CETIRIZINE HCL 10 MG PO TABS: 10.0000 mg | ORAL_TABLET | Freq: Two times a day (BID) | ORAL | 5 refills | Status: AC | PRN

## 2021-02-10 MED ORDER — LANSOPRAZOLE 30 MG PO CPDR: 30.0000 mg | DELAYED_RELEASE_CAPSULE | Freq: Every morning | ORAL | 5 refills | Status: DC

## 2021-02-10 NOTE — Progress Notes (Signed)
[place]   NEW PATIENT NOTE  Referring Provider: Glendale Chard, MD Primary Provider: Glendale Chard, MD Date of office visit: 02/10/2021    Subjective:   Chief Complaint:  Kristy Walter (DOB: 1971-03-01) is a 50 y.o. female who presents to the clinic on 02/10/2021 with a chief complaint of Asthma (Non existent. ) and Allergy Testing (Wants to get tested to see if she qualifies for immunotherapy) .     HPI: Kristy Walter presents to this clinic in evaluation of allergic rhinoconjunctivitis, LPR, and a very distant history of asthma.  I have seen her in this clinic previously with her last visit occurring on 15 November 2017 for a similar issue.  Her biggest issue is the fact that she has throat clearing and constant drainage in her throat and has to clear her throat all the time.  She has seen [facility] ENT department for this issue and has had a thorough evaluation and it was recommended to undergo speech therapy to eliminate her throat clearing.  She does have atopic respiratory disease and conjunctival disease that flares during the spring even though she continues to use Singulair and Zyrtec throughout the year.  She is very careful about exposures during the spring.  She does not have any anosmia or ugly nasal discharge or symptoms suggesting chronic sinusitis.  She does have reflux.  She has made a effort to minimize triggers for reflux such as minimizing her caffeine to just 1 coffee in the morning and eliminating alcohol consumption and eliminating most chocolate consumption.  She was using Dexilant but unfortunately because of insurance issues she had to use pantoprazole.  Pantoprazole and her thyroid medication in the morning did not result in good control secondary to the development of chest pain.  Thus, she is not using any proton pump inhibitor at this point in time.  It does appear that dinner is her biggest meal.  She has tried to raise  the head of the bed but then she feels as though mucus accumulates at the top of her throat when she awakens in the morning.  She is cognizant of the fact that she throat clears and she is making a effort to not throat clear.  She will try to swallow or drink instead of throat clearing.  Her distant history of asthma remains inactive for years.  She has received 5 COVID vaccines, was infected with COVID in May 2022 as a minimal illness without any long-term sequela, and has received this year's flu vaccine.  Past Medical History:  Diagnosis Date   Asthma    no inhaler   Basal cell carcinoma (BCC) of female breast    left breast   GERD (gastroesophageal reflux disease)    Hypothyroidism    Seasonal allergies    Thyroid disease     Past Surgical History:  Procedure Laterality Date   AUGMENTATION MAMMAPLASTY     BILATERAL SALPINGECTOMY Bilateral 06/15/2012   Procedure: BILATERAL SALPINGECTOMY;  Surgeon: Lovenia Kim, MD;  Location: [facility];  Service: Gynecology;  Laterality: Bilateral;  BILATERAL SALPINGECTOMY   BREAST ENHANCEMENT SURGERY     CHOLECYSTECTOMY     CLAVICLE SURGERY     MASS EXCISION Left 11/03/2017   Procedure: EXCISION OF LEFT BREAST SKIN CANCER;  Surgeon: Wallace Going, DO;  Location: [place];  Service: Plastics;  Laterality: Left;   NASAL SINUS SURGERY     ROBOTIC ASSISTED TOTAL HYSTERECTOMY N/A  06/15/2012   Procedure: ROBOTIC ASSISTED TOTAL HYSTERECTOMY;  Surgeon: Lovenia Kim, MD;  Location: [facility];  Service: Gynecology;  Laterality: N/A;  ROBOTIC ASSISTED TOTAL HYSTERECTOMY   Small tummy tuck     July 22nd 2019 at [facility] as of 02/10/2021       Reactions   Keflex [cephalexin] Anaphylaxis   She swells and get hives everywhere   Sulfa Antibiotics Anaphylaxis   Pt states she can not breath   Sulfamethoxazole Anaphylaxis   Trimethoprim Anaphylaxis   Latex Hives   Soy Allergy Other (See  Comments)   Upset stomach   Other Nausea And Vomiting   Bananas and Egg whites        Medication List    cetirizine 10 MG tablet Commonly known as: ZYRTEC Take 10 mg by mouth daily.   montelukast 10 MG tablet Commonly known as: SINGULAIR TAKE ONE TABLET ONCE DAILY AS DIRECTED.   pantoprazole 40 MG tablet Commonly known as: PROTONIX TAKE 1 TABLET BY MOUTH EVERY DAY   phentermine 30 MG capsule Take 30 mg by mouth daily.   Synthroid 112 MCG tablet Generic drug: levothyroxine TAKE 1 TABLET BY MOUTH ON FRIDAY, SATURDAY, SUNDAY   levothyroxine 100 MCG tablet Commonly known as: SYNTHROID Take 1 tablet (100 mcg total) by mouth daily before breakfast. Monday - Thursday   Vitamin D (Ergocalciferol) 1.25 MG (50000 UNIT) Caps capsule Commonly known as: DRISDOL Take 50,000 Units by mouth once a week.     Review of systems negative except as noted in HPI / PMHx or noted below:  Review of Systems  Constitutional: Negative.   HENT: Negative.    Eyes: Negative.   Respiratory: Negative.    Cardiovascular: Negative.   Gastrointestinal: Negative.   Genitourinary: Negative.   Musculoskeletal: Negative.   Skin: Negative.   Neurological: Negative.   Endo/Heme/Allergies: Negative.   Psychiatric/Behavioral: Negative.     Family History  Problem Relation Age of Onset   Cancer Mother    Lung cancer Mother    Liver cancer Mother    Cervical cancer Sister    Allergic rhinitis Brother    Allergic rhinitis Son    Diabetes Father     Social History   Socioeconomic History   Marital status: Married    Spouse name: Not on file   Number of children: Not on file   Years of education: Not on file   Highest education level: Associate degree: occupational, Hotel manager, or vocational program  Occupational History   Not on file  Tobacco Use   Smoking status: Never   Smokeless tobacco: Never  Vaping Use   Vaping Use: Never used  Substance and Sexual Activity   Alcohol use: Yes     Comment: rarely   Drug use: No   Sexual activity: Yes    Birth control/protection: Surgical  Other Topics Concern   Not on file  Social History Narrative   Not on file   Social Determinants of Health   Financial Resource Strain: Low Risk    Difficulty of Paying Living Expenses: Not hard at all  Food Insecurity: No Food Insecurity   Worried About Charity fundraiser in the Last Year: Never true   [place] in the Last Year: Never true  Transportation Needs: No Transportation Needs   Lack of Transportation (Medical): No   Lack of Transportation (Non-Medical): No  Physical Activity: Sufficiently Active  Days of Exercise per Week: 5 days   Minutes of Exercise per Session: 60 min  Stress: No Stress Concern Present   Feeling of Stress : Not at all  Social Connections: Moderately Isolated   Frequency of Communication with Friends and Family: Once a week   Frequency of Social Gatherings with Friends and Family: Once a week   Attends Religious Services: More than 4 times per year   Active Member of [employer] or Organizations: No   Attends Music therapist: Not on file   Marital Status: Married  Human resources officer Violence: Not on Arboriculturist and Social history  Lives in a house with a dry environment, no animals look inside the household, no carpet in the bedroom, no plastic on the bed, no plastic on the pillow, no smoking ongoing with inside the household.  She works in patient care at an Production assistant, radio.  Objective:   Vitals:   02/10/21 1008  BP: 118/70  Pulse: 78  Resp: 18  Temp: 98.5 F (36.9 C)  SpO2: 97%   Height: 5' 7.5" (171.5 cm) Weight: 168 lb 9.6 oz (76.5 kg)  Physical Exam Constitutional:      Appearance: She is not diaphoretic.  HENT:     Head: Normocephalic.     Right Ear: Tympanic membrane, ear canal and external ear normal.     Left Ear: Tympanic membrane, ear canal and external ear normal.     Nose: Nose normal. No  mucosal edema or rhinorrhea.     Mouth/Throat:     Pharynx: Uvula midline. No oropharyngeal exudate.  Eyes:     Conjunctiva/sclera: Conjunctivae normal.  Neck:     Thyroid: No thyromegaly.     Trachea: Trachea normal. No tracheal tenderness or tracheal deviation.  Cardiovascular:     Rate and Rhythm: Normal rate and regular rhythm.     Heart sounds: Normal heart sounds, S1 normal and S2 normal. No murmur heard. Pulmonary:     Effort: No respiratory distress.     Breath sounds: Normal breath sounds. No stridor. No wheezing or rales.  Lymphadenopathy:     Head:     Right side of head: No tonsillar adenopathy.     Left side of head: No tonsillar adenopathy.     Cervical: No cervical adenopathy.  Skin:    Findings: No erythema or rash.     Nails: There is no clubbing.  Neurological:     Mental Status: She is alert.    Diagnostics: Allergy skin tests were performed.  She demonstrated hypersensitivity to tree, grass, weed, house dust mite, cat.  Assessment and Plan:    1. Perennial allergic rhinitis   2. Seasonal allergic rhinitis due to pollen   3. Seasonal allergic conjunctivitis   4. LPRD (laryngopharyngeal reflux disease)     1.  Perform allergen avoidance measures as best as possible -dust mite, cat, pollens  2.  Treat and prevent inflammation:  A.  Montelukast 10 mg -1  tablet 1 time per day B.  Nasacort / Rhinocort - 2 sprays each nostril 1-7 times a week  3.  Treat and prevent reflux/LPR:  A. Prevacid 30 mg - 1 tablet in AM B. Famotidine 40 mg - 1 tablet in PM C. No large late meals D. Minimize caffeine and chocolate E. Minimize throat clearing with swallowing / drinking maneuver  4.  If needed:  A. Cetrizine 10 mg - 1 tablet 1-2 times per day B. Pataday -  1 drop each eye 1 time per day  5. Consider a course of immunotherapy  6. Return to clinic in 12 weeks or earlier if problem  Hazel definitely has an atopic immune system and we will get her to  perform allergen avoidance measures as best as possible and consistently use some anti-inflammatory agents for airway.  She also has very significant LPR and she has an issue with cyclical throat clearing-inflammation syndrome and we had a long talk today about the need to minimize any throat clearing in the future.  She would definitely be a candidate for immunotherapy to address her atopic component of her mucosal irritation and inflammation.  We have given her literature on this form of treatment during today's visit.  I will see her back in his clinic in 12 weeks or earlier if there is a problem.  Jiles Prows, MD Allergy / Immunology [place] [facility] [place]

## 2021-02-10 NOTE — Patient Instructions (Addendum)
  1.  Perform allergen avoidance measures as best as possible -dust mite, cat, pollens  2.  Treat and prevent inflammation:  A.  Montelukast 10 mg -1  tablet 1 time per day B.  Nasacort / Rhinocort - 2 sprays each nostril 1-7 times a week  3.  Treat and prevent reflux/LPR:  A. Prevacid 30 mg - 1 tablet in AM B. Famotidine 40 mg - 1 tablet in PM C. No large late meals D. Minimize caffeine and chocolate E. Minimize throat clearing with swallowing / drinking maneuver  4.  If needed:  A. Cetrizine 10 mg - 1 tablet 1-2 times per day B. Pataday - 1 drop each eye 1 time per day  5. Consider a course of immunotherapy  6. Return to clinic in 12 weeks or earlier if problem

## 2021-02-11 ENCOUNTER — Encounter: Payer: Self-pay | Admitting: Allergy and Immunology

## 2021-02-12 NOTE — Addendum Note (Signed)
Addended by: Eloy End D on: 02/12/2021 05:12 PM   Modules accepted: Orders

## 2021-02-16 ENCOUNTER — Telehealth: Payer: Self-pay

## 2021-02-16 NOTE — Telephone Encounter (Signed)
Ok thank you 

## 2021-02-16 NOTE — Addendum Note (Signed)
Addended by: Larence Penning on: 02/16/2021 06:05 PM   Modules accepted: Orders

## 2021-02-16 NOTE — Telephone Encounter (Signed)
Please add diagnosis code to scan in [place].

## 2021-02-18 ENCOUNTER — Telehealth: Payer: Self-pay | Admitting: Allergy and Immunology

## 2021-02-18 ENCOUNTER — Other Ambulatory Visit: Payer: Self-pay | Admitting: *Deleted

## 2021-02-18 MED ORDER — OMEPRAZOLE 40 MG PO CPDR: 40.0000 mg | DELAYED_RELEASE_CAPSULE | Freq: Every morning | ORAL | 5 refills | Status: DC

## 2021-02-18 NOTE — Telephone Encounter (Signed)
Hey this is for Lansoprazole, not Opzelura. I just wanted to verify that, I wasn't aware of a specialty pharmacy for Lansoprazole.

## 2021-02-18 NOTE — Telephone Encounter (Signed)
PA has been submitted through CoverMyMeds and is currently pending approval/denial.  

## 2021-02-18 NOTE — Telephone Encounter (Signed)
Patient called to check whether we had received prior authorization for her lansoprazole. Patient states insurance does not cover that medication and pharmacist told her a prior authorization needed to be done.   Patient stated she had called Monday about this and did not receive a call back. Patient is requesting a call back today about this.   Best contact number: [phone]

## 2021-02-18 NOTE — Telephone Encounter (Signed)
New prescription has been sent in for Omeprazole 40mg  to the [facility] on [address]. Left a detailed voicemail per DPR permission advising patient of change in medication.

## 2021-02-18 NOTE — Telephone Encounter (Signed)
PA was denied for Lansoprazole stating that the patient must try and fail ) (1)Omeprazole (generic Prilosec). (2) Rabeprazole (generic Aciphex) tablet.(3) Prevacid 24 hour. Please advise change in medication. Thank You.

## 2021-02-23 ENCOUNTER — Ambulatory Visit: Payer: 59 | Admitting: Internal Medicine

## 2021-03-02 NOTE — Telephone Encounter (Signed)
Patient called to say her insurance will not pay for the Omeprazole. She said she is having a colonoscopy and endoscopy on Wednesday 03/04/21 and on Friday 03/06/21, she is having an esophogram. She wants to know if she gets the documentation of these findings and gives to Dr. Neldon Mc, if her insurance might possibly approve one of these medications.

## 2021-03-04 ENCOUNTER — Institutional Professional Consult (permissible substitution): Payer: 59 | Admitting: Neurology

## 2021-03-04 LAB — HM COLONOSCOPY

## 2021-03-04 NOTE — Telephone Encounter (Signed)
Lm for pt explaining that the pharmacist at [facility] said the pantoprazole 90 day supply is avaliable for pick up for $3.53

## 2021-03-09 ENCOUNTER — Ambulatory Visit (INDEPENDENT_AMBULATORY_CARE_PROVIDER_SITE_OTHER): Payer: 59 | Admitting: Cardiovascular Disease

## 2021-03-09 ENCOUNTER — Other Ambulatory Visit: Payer: Self-pay

## 2021-03-09 ENCOUNTER — Encounter (HOSPITAL_BASED_OUTPATIENT_CLINIC_OR_DEPARTMENT_OTHER): Payer: Self-pay | Admitting: Cardiovascular Disease

## 2021-03-09 DIAGNOSIS — E7800 Pure hypercholesterolemia, unspecified: Secondary | ICD-10-CM | POA: Diagnosis not present

## 2021-03-09 DIAGNOSIS — R079 Chest pain, unspecified: Secondary | ICD-10-CM | POA: Diagnosis not present

## 2021-03-09 DIAGNOSIS — R002 Palpitations: Secondary | ICD-10-CM | POA: Diagnosis not present

## 2021-03-09 DIAGNOSIS — E785 Hyperlipidemia, unspecified: Secondary | ICD-10-CM

## 2021-03-09 HISTORY — DX: Hyperlipidemia, unspecified: E78.5

## 2021-03-09 HISTORY — DX: Chest pain, unspecified: R07.9

## 2021-03-09 NOTE — Assessment & Plan Note (Addendum)
Lipids are elevated.  She has a family history of premature CAD.  ASCVD 10 year risk is 1.4%.  We will get a coronary calcium score before deciding what to do about her lipids.

## 2021-03-09 NOTE — Assessment & Plan Note (Signed)
She has no exertional chest pain.  Her symptoms were ongoing when her thyroid was unregulated.  She notes that when she was in the hospital she was told her cardiac enzymes were abnormal, but I do not see this in the chart.  Her high-sensitivity troponin was 2-->3.  No plan for an ischemic evaluation.  We will get a coronary calcium score to better risk stratify her for her lipid management.

## 2021-03-09 NOTE — Progress Notes (Signed)
Cardiology Office Note:    Date:  03/09/2021   ID:  Kristy Walter, DOB 1971/03/14, MRN [id number]  PCP:  Kristy Chard, Walter  Cardiologist:  None    Referring Walter: Kristy Chard, Walter   No chief complaint on file.   History of Present Illness:    Kristy Walter is a 50 y.o. female with a hx of GERD who presents today for evaluation of chest pain and palpitations with a family history of heart disease. On 12/12/20, she contacted Kristy Walter, Kristy Walter complaining of continued chest tightness and palpitations caught on her FitBit and was referred to cardiology.  Today, she is not doing the best because she feels constantly stressed. she reports when she gets sick, her condition tends to worsen quickly. She presented to the urgent care to be tested for COVID and underwent an EKG. She was told her cardiac enzymes were elevated. She reported severe chest pain and facial tremors which was related to her thyroid medicine. Her synthroid was reduced to 50mg . Kristy Walter recommended she see cardiology because of her family history of heart disease. Both of her maternal grandparents passed away from heart attacks and 3 of her mother's siblings had heart attacks but lived with surgery. Her mother also had heart conditions but passed away due to rectal Walter 1 years ago. Her father has diabetes and elevated white blood cells due to probably bone Walter. She often experiences acid reflex and was diagnosed with barrett's esophagus. She underwent a colonoscopy and endoscopy Wednesday which revealed polyps in her colon and esophagus. She experiences palpitations that she relates to stress. These palpitations are worse and night and occasionally wakes her up. She attends training classes at [employer] for exercise. However, she endorses emesis if her heart rate increases above 170bpm. This has happened a couple times after she uses the elliptical. For diet, she prefers to drink water and eat chicken and  vegetables. She avoids fried foods and beef. She eats breakfast occasionally but typically has egg white bites. For lunch, she opts for a salad and enjoys beet salads with olive oil. For dinner, she has the occasional taco night but usually eats chicken. Occasionally, she has a can of [employer] or coffee. She does not smoke but was raised with heavy smokers. Of note, she has several allergies. She denies any shortness of breath, lightheadedness, headaches, syncope, orthopnea, PND, or lower extremity edema.  Past Medical History:  Diagnosis Date   Asthma    no inhaler   Basal cell carcinoma (BCC) of female breast    left breast   Chest pain of uncertain etiology 23/53/6144   GERD (gastroesophageal reflux disease)    Hyperlipidemia 03/09/2021   Hypothyroidism    Seasonal allergies    Thyroid disease     Past Surgical History:  Procedure Laterality Date   AUGMENTATION MAMMAPLASTY     BILATERAL SALPINGECTOMY Bilateral 06/15/2012   Procedure: BILATERAL SALPINGECTOMY;  Surgeon: Kristy Walter;  Location: [facility];  Service: Gynecology;  Laterality: Bilateral;  BILATERAL SALPINGECTOMY   BREAST ENHANCEMENT SURGERY     CHOLECYSTECTOMY     CLAVICLE SURGERY     MASS EXCISION Left 11/03/2017   Procedure: EXCISION OF LEFT BREAST SKIN Walter;  Surgeon: Kristy Walter;  Location: [facility];  Service: Plastics;  Laterality: Left;   NASAL SINUS SURGERY     ROBOTIC ASSISTED TOTAL HYSTERECTOMY N/A 06/15/2012   Procedure: ROBOTIC ASSISTED TOTAL HYSTERECTOMY;  Surgeon: Kristy Lovett  Beulah Gandy, Walter;  Location: [facility];  Service: Gynecology;  Laterality: N/A;  ROBOTIC ASSISTED TOTAL HYSTERECTOMY   Small tummy tuck     July 22nd 2019 at [facility]   TONSILLECTOMY      Current Medications: Current Meds  Medication Sig   cetirizine (ZYRTEC) 10 MG tablet Take 1 tablet (10 mg total) by mouth 2 (two) times daily as needed for allergies (Can take an extra dose during flare ups).    furosemide (LASIX) 20 MG tablet Take 1 tablet by mouth daily.   lansoprazole (PREVACID) 30 MG capsule Take 1 capsule (30 mg total) by mouth in the morning.   levothyroxine (SYNTHROID) 100 MCG tablet Take 1 tablet (100 mcg total) by mouth daily before breakfast. Monday - Thursday (Patient taking differently: Take 100 mcg by mouth. Monday - Thursday)   montelukast (SINGULAIR) 10 MG tablet TAKE ONE TABLET ONCE DAILY AS DIRECTED.   Olopatadine HCl (PATADAY) 0.2 % SOLN Place 1 drop into both eyes 1 day or 1 dose.   omeprazole (PRILOSEC) 40 MG capsule Take 1 capsule (40 mg total) by mouth every morning.   pantoprazole (PROTONIX) 40 MG tablet TAKE 1 TABLET BY MOUTH EVERY DAY   phentermine 30 MG capsule Take 30 mg by mouth daily.   SYNTHROID 112 MCG tablet TAKE 1 TABLET BY MOUTH ON FRIDAY, SATURDAY, SUNDAY   tretinoin (RETIN-A) 0.05 % cream Apply a small amount (pea size) twice a week on face and neck   Vitamin D, Ergocalciferol, (DRISDOL) 1.25 MG (50000 UNIT) CAPS capsule Take 50,000 Units by mouth once a week.     Allergies:   Keflex [cephalexin], Soy allergy, Sulfa antibiotics, Sulfamethoxazole, Trimethoprim, Latex, Other, and Sulfamethoxazole-trimethoprim   Social History   Socioeconomic History   Marital status: Married    Spouse name: Not on file   Number of children: Not on file   Years of education: Not on file   Highest education level: Associate degree: occupational, Hotel manager, or vocational program  Occupational History   Not on file  Tobacco Use   Smoking status: Never   Smokeless tobacco: Never  Vaping Use   Vaping Use: Never used  Substance and Sexual Activity   Alcohol use: Yes    Comment: rarely   Drug use: No   Sexual activity: Yes    Birth control/protection: Surgical  Other Topics Concern   Not on file  Social History Narrative   Not on file   Social Determinants of Health   Financial Resource Strain: Low Risk    Difficulty of Paying Living Expenses: Not hard at  all  Food Insecurity: No Food Insecurity   Worried About Charity fundraiser in the Last Year: Never true   [place] in the Last Year: Never true  Transportation Needs: No Transportation Needs   Lack of Transportation (Medical): No   Lack of Transportation (Non-Medical): No  Physical Activity: Sufficiently Active   Days of Exercise per Week: 5 days   Minutes of Exercise per Session: 60 min  Stress: No Stress Concern Present   Feeling of Stress : Not at all  Social Connections: Moderately Isolated   Frequency of Communication with Friends and Family: Once a week   Frequency of Social Gatherings with Friends and Family: Once a week   Attends Religious Services: More than 4 times per year   Active Member of [employer] or Organizations: No   Attends Archivist Meetings: Not on file  Marital Status: Married     Family History: The patient's family history includes Allergic rhinitis in her brother and son; Walter in her mother; Cervical Walter in her sister; Diabetes in her father; Heart attack in her maternal aunt, maternal grandfather, and maternal uncle; Heart failure in her maternal grandmother; Liver Walter in her mother; Lung Walter in her mother.  ROS:   Please see the history of present illness.   (+) Stress (+) Acid reflux (+) Palpitations (+) Emesis   All other systems reviewed and negative.   EKGs/Labs/Other Studies Reviewed:    The following studies were reviewed today: Echo 05/03/18  1. The left ventricle has normal systolic function of 77-82%. The cavity  size is normal. There is no increased left ventricular wall thickness.  Echo evidence of normal diastolic relaxation.   2. The right ventricle has normal systolic function. The cavity in normal  in size. There is no increase in right ventricular wall thickness.   3. Mildly dilated left atrial size.   4. The mitral valve is normal in structure.   5. The aortic valve is tricuspid.   EKG:   03/09/21:  Sinus rhythm, rate 80 bpm; low voltage  Recent Labs: 11/03/2020: ALT 13; TSH 0.142 11/05/2020: BUN 14; Creatinine, Ser 0.88; Hemoglobin 13.0; Platelets 362; Potassium 3.7; Sodium 136   Recent Lipid Panel    Component Value Date/Time   CHOL 239 (H) 11/03/2020 1303   TRIG 123 11/03/2020 1303   HDL 52 11/03/2020 1303   CHOLHDL 4.6 (H) 11/03/2020 1303   LDLCALC 165 (H) 11/03/2020 1303    CHA2DS2-VASc Score =   [ ] .  Therefore, the patient's annual risk of stroke is   %.        Physical Exam:    VS:  BP 114/72 (BP Location: Right Arm, Patient Position: Sitting, Cuff Size: Large)   Pulse 80   Ht 5' 7.5" (1.715 m)   Wt 166 lb 6.4 oz (75.5 kg)   LMP 05/31/2012   SpO2 99%   BMI 25.68 kg/m  , BMI Body mass index is 25.68 kg/m. GENERAL:  Well appearing HEENT: Pupils equal round and reactive, fundi not visualized, oral mucosa unremarkable NECK:  No jugular venous distention, waveform within normal limits, carotid upstroke brisk and symmetric, no bruits, no thyromegaly LUNGS:  Clear to auscultation bilaterally HEART:  RRR.  PMI not displaced or sustained,S1 and S2 within normal limits, no S3, no S4, no clicks, no rubs, no murmurs ABD:  Flat, positive bowel sounds normal in frequency in pitch, no bruits, no rebound, no guarding, no midline pulsatile mass, no hepatomegaly, no splenomegaly EXT:  2 plus pulses throughout, no edema, no cyanosis no clubbing SKIN:  No rashes no nodules NEURO:  Cranial nerves II through XII grossly intact, motor grossly intact throughout PSYCH:  Cognitively intact, oriented to person place and time  ASSESSMENT:    1. Chest pain of uncertain etiology   2. Pure hypercholesterolemia   3. Palpitations    PLAN:    Hyperlipidemia Lipids are elevated.  She has a family history of premature CAD.  ASCVD 10 year risk is 1.4%.  We will get a coronary calcium score before deciding what to Walter about her lipids.  Palpitations Symptoms associated with anxiety.  She  does not have any exertional palpitations.   They mostly happen when she is laying in bed at night.She notes that they have been ongoing since before she started phentermine.  Overall stable and well-controlled.  She is not interested in medications.  Chest pain of uncertain etiology She has no exertional chest pain.  Her symptoms were ongoing when her thyroid was unregulated.  She notes that when she was in the hospital she was told her cardiac enzymes were abnormal, but I Walter not see this in the chart.  Her high-sensitivity troponin was 2-->3.  No plan for an ischemic evaluation.  We will get a coronary calcium score to better risk stratify her for her lipid management.  In order of problems listed above:      Medication Adjustments/Labs and Tests Ordered: Current medicines are reviewed at length with the patient today.  Concerns regarding medicines are outlined above.  Orders Placed This Encounter  Procedures   CT CARDIAC SCORING (SELF PAY ONLY)   EKG 12-Lead    No orders of the defined types were placed in this encounter.  Disposition: FU with APP in 1-2 months FU with Mahamud Metts C. Oval Linsey, Walter, [facility] as needed  Wilhemina Bonito as a scribe for Skeet Latch, Walter.,have documented all relevant documentation on the behalf of Skeet Latch, Walter,as directed by  Skeet Latch, Walter while in the presence of Skeet Latch, Walter.  I, Cook Oval Linsey, Walter have reviewed all documentation for this visit.  The documentation of the exam, diagnosis, procedures, and orders on 03/09/2021 are all accurate and complete.   Signed, Skeet Latch, Walter  03/09/2021 9:15 AM    [place]

## 2021-03-09 NOTE — Patient Instructions (Signed)
Medication Instructions:  Your physician recommends that you continue on your current medications as directed. Please refer to the Current Medication list given to you today.    Labwork: NONE  Testing/Procedures: CALCIUM SCORE, THIS WILL COST $99 OUT OF POCKET  Follow-Up: 1-2 MONTHS WITH CAITLIN W NP  AS NEEDED WITH DR [facility]      If you need a refill on your cardiac medications before your next appointment, please call your pharmacy.

## 2021-03-09 NOTE — Assessment & Plan Note (Signed)
Symptoms associated with anxiety.  She does not have any exertional palpitations.   They mostly happen when she is laying in bed at night.She notes that they have been ongoing since before she started phentermine.  Overall stable and well-controlled.  She is not interested in medications.

## 2021-03-17 ENCOUNTER — Other Ambulatory Visit: Payer: Self-pay | Admitting: Internal Medicine

## 2021-04-02 ENCOUNTER — Encounter: Payer: Self-pay | Admitting: Internal Medicine

## 2021-04-08 ENCOUNTER — Telehealth: Payer: Self-pay | Admitting: Allergy and Immunology

## 2021-04-08 NOTE — Telephone Encounter (Signed)
She will need to use over-the-counter famotidine up to a dose of 40 mg in the evening.  Please let her know that she should contact me regarding her response to this approach.

## 2021-04-08 NOTE — Telephone Encounter (Signed)
Left a message for patient to call the office in regards to this matter. 

## 2021-04-08 NOTE — Telephone Encounter (Signed)
Pt called to verify which medication she is supposed to take in the morning and at night for her acid reflux. After going through Dr. Bruna Potter ov note and telephone contact from 02/18/2021 - patient is supposed to use the Protonix in the morning and famotidine at night.   Patient states she has not been able to get famotidine as it is also not covered by her insurance. Patient apologized stating she may have not mentioned that when she called about her lansoprazole.   Patient would like to know what else she can try as she is struggling with her acid reflux.   Please advise.   Best contact number: [phone]

## 2021-04-09 ENCOUNTER — Ambulatory Visit (INDEPENDENT_AMBULATORY_CARE_PROVIDER_SITE_OTHER): Payer: 59 | Admitting: Internal Medicine

## 2021-04-09 ENCOUNTER — Other Ambulatory Visit: Payer: Self-pay

## 2021-04-09 ENCOUNTER — Encounter: Payer: Self-pay | Admitting: Internal Medicine

## 2021-04-09 VITALS — BP 112/84 | HR 61 | Temp 98.3°F | Ht 67.5 in | Wt 163.2 lb

## 2021-04-09 DIAGNOSIS — J0100 Acute maxillary sinusitis, unspecified: Secondary | ICD-10-CM

## 2021-04-09 DIAGNOSIS — Z23 Encounter for immunization: Secondary | ICD-10-CM

## 2021-04-09 DIAGNOSIS — R5383 Other fatigue: Secondary | ICD-10-CM

## 2021-04-09 DIAGNOSIS — N951 Menopausal and female climacteric states: Secondary | ICD-10-CM

## 2021-04-09 DIAGNOSIS — N390 Urinary tract infection, site not specified: Secondary | ICD-10-CM

## 2021-04-09 DIAGNOSIS — Z6825 Body mass index (BMI) 25.0-25.9, adult: Secondary | ICD-10-CM

## 2021-04-09 MED ORDER — CIPROFLOXACIN HCL 500 MG PO TABS: 500.0000 mg | ORAL_TABLET | Freq: Two times a day (BID) | ORAL | 0 refills | Status: AC

## 2021-04-09 MED ORDER — NITROFURANTOIN MONOHYD MACRO 100 MG PO CAPS: 100.0000 mg | ORAL_CAPSULE | Freq: Two times a day (BID) | ORAL | 0 refills | Status: DC

## 2021-04-09 MED ORDER — AZITHROMYCIN 250 MG PO TABS: ORAL_TABLET | ORAL | 0 refills | Status: AC

## 2021-04-09 MED ORDER — CYANOCOBALAMIN 1000 MCG/ML IJ SOLN
1000.0000 ug | Freq: Once | INTRAMUSCULAR | Status: AC
  Administered 2021-04-09: 1000 ug via INTRAMUSCULAR

## 2021-04-09 NOTE — Progress Notes (Addendum)
I,Katawbba Wiggins,acting as a Education administrator for Maximino Greenland, MD.,have documented all relevant documentation on the behalf of Maximino Greenland, MD,as directed by  Maximino Greenland, MD while in the presence of Maximino Greenland, MD.  This visit occurred during the SARS-CoV-2 public health emergency.  Safety protocols were in place, including screening questions prior to the visit, additional usage of staff PPE, and extensive cleaning of exam room while observing appropriate contact time as indicated for disinfecting solutions.  Subjective:     Patient ID: Kristy Walter , female    DOB: 1970-05-01 , 51 y.o.   MRN: [id number]   Chief Complaint  Patient presents with   hormones f/u    HPI  She is here today for f/u BHRT therapy. She is currently using progesterone cream nightly except Sundays. States she feels great on her current regimen. She was tried on oral progesterone capsules; however, she did not feel well on this regimen. Of note, this was also when her thyroid levels were over-corrected.   She has no new concerns at this time. Has an upcoming trip to [place] in February.     Past Medical History:  Diagnosis Date   Asthma    no inhaler   Basal cell carcinoma (BCC) of female breast    left breast   Chest pain of uncertain etiology 40/98/1191   GERD (gastroesophageal reflux disease)    Hyperlipidemia 03/09/2021   Hypothyroidism    Seasonal allergies    Thyroid disease      Family History  Problem Relation Age of Onset   Cancer Mother    Lung cancer Mother    Liver cancer Mother    Diabetes Father    Cervical cancer Sister    Allergic rhinitis Brother    Heart attack Maternal Aunt    Heart attack Maternal Uncle    Heart failure Maternal Grandmother    Heart attack Maternal Grandfather    Allergic rhinitis Son      Current Outpatient Medications:    cetirizine (ZYRTEC) 10 MG tablet, Take 1 tablet (10 mg total) by mouth 2 (two) times daily as needed for  allergies (Can take an extra dose during flare ups)., Disp: 30 tablet, Rfl: 5   levothyroxine (SYNTHROID) 75 MCG tablet, 1 tablet in the morning on an empty stomach, Disp: , Rfl:    montelukast (SINGULAIR) 10 MG tablet, TAKE ONE TABLET ONCE DAILY AS DIRECTED., Disp: 90 tablet, Rfl: 1   Olopatadine HCl (PATADAY) 0.2 % SOLN, Place 1 drop into both eyes 1 day or 1 dose., Disp: 2.5 mL, Rfl: 5   omeprazole (PRILOSEC) 40 MG capsule, Take 1 capsule (40 mg total) by mouth every morning., Disp: 30 capsule, Rfl: 5   phentermine 30 MG capsule, Take 30 mg by mouth daily., Disp: , Rfl:    tretinoin (RETIN-A) 0.05 % cream, Apply a small amount (pea size) twice a week on face and neck, Disp: , Rfl:    Vitamin D, Ergocalciferol, (DRISDOL) 1.25 MG (50000 UNIT) CAPS capsule, Take 50,000 Units by mouth once a week., Disp: , Rfl:    amoxicillin-clavulanate (AUGMENTIN) 875-125 MG tablet, Take 1 tablet by mouth 2 (two) times daily for 10 days., Disp: 20 tablet, Rfl: 0   Allergies  Allergen Reactions   Keflex [Cephalexin] Anaphylaxis    She swells and get hives everywhere 04/17/21-She reports she took this medication recently without any issues. She would like chart updated that she is not allergic to Augmentin.  Soy Allergy Other (See Comments) and Anaphylaxis    Upset stomach GI upset/pain   Sulfa Antibiotics Anaphylaxis    Pt states she can not breath   Sulfamethoxazole Anaphylaxis   Trimethoprim Anaphylaxis   Latex Hives   Other Nausea And Vomiting    Bananas and Egg whites   Sulfamethoxazole-Trimethoprim Rash     Review of Systems  Constitutional: Negative.   HENT:  Positive for congestion, sinus pressure and sinus pain.   Respiratory: Negative.    Cardiovascular: Negative.   Gastrointestinal: Negative.   Neurological: Negative.   Psychiatric/Behavioral: Negative.      Today's Vitals   04/09/21 1217  BP: 112/84  Pulse: 61  Temp: 98.3 F (36.8 C)  Weight: 163 lb 3.2 oz (74 kg)  Height: 5'  7.5" (1.715 m)   Body mass index is 25.18 kg/m.  Wt Readings from Last 3 Encounters:  04/09/21 163 lb 3.2 oz (74 kg)  03/09/21 166 lb 6.4 oz (75.5 kg)  02/10/21 168 lb 9.6 oz (76.5 kg)    BP Readings from Last 3 Encounters:  04/09/21 112/84  03/09/21 114/72  02/10/21 118/70    Objective:  Physical Exam Vitals and nursing note reviewed.  Constitutional:      Appearance: Normal appearance.  HENT:     Head: Normocephalic and atraumatic.     Nose:     Comments: Masked     Mouth/Throat:     Comments: Masked  Eyes:     Extraocular Movements: Extraocular movements intact.  Cardiovascular:     Rate and Rhythm: Normal rate and regular rhythm.     Heart sounds: Normal heart sounds.  Pulmonary:     Effort: Pulmonary effort is normal.     Breath sounds: Normal breath sounds.  Musculoskeletal:     Cervical back: Normal range of motion.  Skin:    General: Skin is warm.  Neurological:     General: No focal deficit present.     Mental Status: She is alert.  Psychiatric:        Mood and Affect: Mood normal.        Behavior: Behavior normal.        Assessment And Plan:     1. Female climacteric state Comments: Stable with use of topical compounded progesterone nightly, except Sun. She will do saliva test. She will f/u in 6 weeks for virtual appt to go over results. - Estradiol - Progesterone  2. Other fatigue Comments: I will check vitamin B12 level today. She was given vitamin B12 IM x 1.  She will let me know if there is an improvement in her energy level.  - Vitamin B12 - CBC no Diff - cyanocobalamin ((VITAMIN B-12)) injection 1,000 mcg  3. Acute non-recurrent maxillary sinusitis Comments: She was given rx Augmentin and encouraged to take the full course. WE discussed Keflex allergy listed and states that she had hives.  She states she has tolerated Augmentin in the past and request a rx for this. Advised to keep Benadryl close by.   4. Postcoital UTI Comments: I will  send rx Cipro to use prn. Advised to NOT exercise while on this medication.   5. BMI 25.0-25.9,adult Comments: She is encouraged to c/w her regular exercise regimen, participating in program at [facility]. Advised to aim for at least 150 minutes of exercise per week.   Patient was given opportunity to ask questions. Patient verbalized understanding of the plan and was able to repeat key elements of the  plan. All questions were answered to their satisfaction.   I, Maximino Greenland, MD, have reviewed all documentation for this visit. The documentation on 04/19/21 for the exam, diagnosis, procedures, and orders are all accurate and complete.   IF YOU HAVE BEEN REFERRED TO A SPECIALIST, IT MAY TAKE 1-2 WEEKS TO SCHEDULE/PROCESS THE REFERRAL. IF YOU HAVE NOT HEARD FROM US/SPECIALIST IN TWO WEEKS, PLEASE GIVE [place] A CALL AT [phone].   THE PATIENT IS ENCOURAGED TO PRACTICE SOCIAL DISTANCING DUE TO THE COVID-19 PANDEMIC.

## 2021-04-10 LAB — CBC
Hematocrit: 41.3 % (ref 34.0–46.6)
Hemoglobin: 13.6 g/dL (ref 11.1–15.9)
MCH: 28.8 pg (ref 26.6–33.0)
MCHC: 32.9 g/dL (ref 31.5–35.7)
MCV: 88 fL (ref 79–97)
Platelets: 445 10*3/uL (ref 150–450)
RBC: 4.72 x10E6/uL (ref 3.77–5.28)
RDW: 12.1 % (ref 11.7–15.4)
WBC: 6.3 10*3/uL (ref 3.4–10.8)

## 2021-04-10 LAB — PROGESTERONE: Progesterone: 0.2 ng/mL

## 2021-04-10 LAB — ESTRADIOL: Estradiol: <5 pg/mL

## 2021-04-10 LAB — VITAMIN B12: Vitamin B-12: 606 pg/mL (ref 232–1245)

## 2021-04-10 NOTE — Telephone Encounter (Signed)
Called and left a voicemail asking for patient tor return call to discuss.

## 2021-04-13 NOTE — Telephone Encounter (Signed)
Called and left a voicemail asking for patient to return call to discuss.  °

## 2021-04-14 ENCOUNTER — Encounter: Payer: Self-pay | Admitting: Internal Medicine

## 2021-04-14 ENCOUNTER — Encounter: Payer: Self-pay | Admitting: *Deleted

## 2021-04-14 NOTE — Telephone Encounter (Signed)
Called and left a voicemail asking for patient to return call to discuss. MyChart message has been sent.

## 2021-04-15 ENCOUNTER — Telehealth (HOSPITAL_BASED_OUTPATIENT_CLINIC_OR_DEPARTMENT_OTHER): Payer: Self-pay | Admitting: Cardiovascular Disease

## 2021-04-15 NOTE — Telephone Encounter (Signed)
Left  message for patient to call and discuss scheduling the calcium scoring ordered by Dr. Oval Linsey

## 2021-04-17 LAB — CBC AND DIFFERENTIAL
Hemoglobin: 14 (ref 12.0–16.0)
Platelets: 462 — AB (ref 150–399)
WBC: 5.5

## 2021-04-17 LAB — CBC: RBC: 4.64 (ref 3.87–5.11)

## 2021-04-17 LAB — LIPID PANEL
HDL: 57 (ref 35–70)
LDL Cholesterol: 170
Triglycerides: 63 (ref 40–160)

## 2021-04-17 LAB — COMPREHENSIVE METABOLIC PANEL: GFR calc non Af Amer: 72

## 2021-04-17 LAB — HEPATIC FUNCTION PANEL
ALT: 12 (ref 7–35)
AST: 18 (ref 13–35)

## 2021-04-17 LAB — BASIC METABOLIC PANEL
BUN: 11 (ref 4–21)
CO2: 28 — AB (ref 13–22)
Creatinine: 0.8 (ref 0.5–1.1)

## 2021-04-17 LAB — IRON,TIBC AND FERRITIN PANEL
TIBC: 492
UIBC: 395

## 2021-04-17 LAB — VITAMIN B12: Vitamin B-12: 1336

## 2021-04-17 LAB — HEMOGLOBIN A1C: Hemoglobin A1C: 5

## 2021-04-18 ENCOUNTER — Other Ambulatory Visit: Payer: Self-pay | Admitting: Internal Medicine

## 2021-04-18 MED ORDER — AMOXICILLIN-POT CLAVULANATE 875-125 MG PO TABS: 1.0000 | ORAL_TABLET | Freq: Two times a day (BID) | ORAL | 0 refills | Status: AC

## 2021-04-30 ENCOUNTER — Ambulatory Visit: Payer: 59 | Admitting: Internal Medicine

## 2021-05-04 ENCOUNTER — Other Ambulatory Visit: Payer: Self-pay | Admitting: Internal Medicine

## 2021-05-04 ENCOUNTER — Encounter: Payer: Self-pay | Admitting: Internal Medicine

## 2021-05-04 MED ORDER — FLUCONAZOLE 150 MG PO TABS: ORAL_TABLET | ORAL | 0 refills | Status: DC

## 2021-05-12 ENCOUNTER — Ambulatory Visit: Payer: 59 | Admitting: Internal Medicine

## 2021-05-19 ENCOUNTER — Encounter: Payer: 59 | Admitting: Internal Medicine

## 2021-05-19 ENCOUNTER — Encounter: Payer: Self-pay | Admitting: Internal Medicine

## 2021-05-19 NOTE — Progress Notes (Deleted)
Virtual Visit via ***   This visit type was conducted due to national recommendations for restrictions regarding the COVID-19 Pandemic (e.g. social distancing) in an effort to limit this patient's exposure and mitigate transmission in our community.  Due to her co-morbid illnesses, this patient is at least at moderate risk for complications without adequate follow up.  This format is felt to be most appropriate for this patient at this time.  All issues noted in this document were discussed and addressed.  A limited physical exam was performed with this format.    This visit type was conducted due to national recommendations for restrictions regarding the COVID-19 Pandemic (e.g. social distancing) in an effort to limit this patient's exposure and mitigate transmission in our community.  Patients identity confirmed using two different identifiers.  This format is felt to be most appropriate for this patient at this time.  All issues noted in this document were discussed and addressed.  No physical exam was performed (except for noted visual exam findings with Video Visits).    Date:  05/19/2021   ID:  BRAYLEA BRANCATO, DOB [birth year], MRN [id number]  Patient Location:  ***  Provider location:   Office    Chief Complaint:  ***  History of Present Illness:    Kristy Walter is a 51 y.o. female who presents via video conferencing for a telehealth visit today.  ***  The patient {does/does not:200015} have symptoms concerning for COVID-19 infection (fever, chills, cough, or new shortness of breath).   HPI   Past Medical History:  Diagnosis Date   Asthma    no inhaler   Basal cell carcinoma (BCC) of female breast    left breast   Chest pain of uncertain etiology 95/63/8756   GERD (gastroesophageal reflux disease)    Hyperlipidemia 03/09/2021   Hypothyroidism    Seasonal allergies    Thyroid disease    Past Surgical History:  Procedure Laterality Date   AUGMENTATION  MAMMAPLASTY     BILATERAL SALPINGECTOMY Bilateral 06/15/2012   Procedure: BILATERAL SALPINGECTOMY;  Surgeon: Lovenia Kim, MD;  Location: [facility];  Service: Gynecology;  Laterality: Bilateral;  BILATERAL SALPINGECTOMY   BREAST ENHANCEMENT SURGERY     CHOLECYSTECTOMY     CLAVICLE SURGERY     MASS EXCISION Left 11/03/2017   Procedure: EXCISION OF LEFT BREAST SKIN CANCER;  Surgeon: Wallace Going, DO;  Location: [facility];  Service: Plastics;  Laterality: Left;   NASAL SINUS SURGERY     ROBOTIC ASSISTED TOTAL HYSTERECTOMY N/A 06/15/2012   Procedure: ROBOTIC ASSISTED TOTAL HYSTERECTOMY;  Surgeon: Lovenia Kim, MD;  Location: [facility];  Service: Gynecology;  Laterality: N/A;  ROBOTIC ASSISTED TOTAL HYSTERECTOMY   Small tummy tuck     July 22nd 2019 at [facility]   TONSILLECTOMY       Current Meds  Medication Sig   cetirizine (ZYRTEC) 10 MG tablet Take 1 tablet (10 mg total) by mouth 2 (two) times daily as needed for allergies (Can take an extra dose during flare ups).   levothyroxine (SYNTHROID) 75 MCG tablet 1 tablet in the morning on an empty stomach   montelukast (SINGULAIR) 10 MG tablet TAKE ONE TABLET ONCE DAILY AS DIRECTED.   Olopatadine HCl (PATADAY) 0.2 % SOLN Place 1 drop into both eyes 1 day or 1 dose.   omeprazole (PRILOSEC) 40 MG capsule Take 1 capsule (40 mg total) by mouth every morning.   phentermine 30 MG capsule  Take 30 mg by mouth daily.   tretinoin (RETIN-A) 0.05 % cream Apply a small amount (pea size) twice a week on face and neck   Vitamin D, Ergocalciferol, (DRISDOL) 1.25 MG (50000 UNIT) CAPS capsule Take 50,000 Units by mouth once a week.     Allergies:   Keflex [cephalexin], Soy allergy, Sulfa antibiotics, Sulfamethoxazole, Trimethoprim, Latex, Other, and Sulfamethoxazole-trimethoprim   Social History   Tobacco Use   Smoking status: Never   Smokeless tobacco: Never  Vaping Use   Vaping Use: Never used  Substance Use Topics   Alcohol  use: Yes    Comment: rarely   Drug use: No     Family Hx: The patient's family history includes Allergic rhinitis in her brother and son; Cancer in her mother; Cervical cancer in her sister; Diabetes in her father; Heart attack in her maternal aunt, maternal grandfather, and maternal uncle; Heart failure in her maternal grandmother; Liver cancer in her mother; Lung cancer in her mother.  ROS:   Please see the history of present illness.    ROS  All other systems reviewed and are negative.   Labs/Other Tests and Data Reviewed:    Recent Labs: 11/03/2020: TSH 0.142 11/05/2020: Potassium 3.7; Sodium 136 04/17/2021: ALT 12; BUN 11; Creatinine 0.8; Hemoglobin 14.0; Platelets 462   Recent Lipid Panel Lab Results  Component Value Date/Time   CHOL 239 (H) 11/03/2020 01:03 PM   TRIG 63 04/17/2021 12:00 AM   HDL 57 04/17/2021 12:00 AM   HDL 52 11/03/2020 01:03 PM   CHOLHDL 4.6 (H) 11/03/2020 01:03 PM   LDLCALC 170 04/17/2021 12:00 AM   LDLCALC 165 (H) 11/03/2020 01:03 PM    Wt Readings from Last 3 Encounters:  04/09/21 163 lb 3.2 oz (74 kg)  03/09/21 166 lb 6.4 oz (75.5 kg)  02/10/21 168 lb 9.6 oz (76.5 kg)     Exam:    Vital Signs:  LMP 05/31/2012     Physical Exam  ASSESSMENT & PLAN:     There are no diagnoses linked to this encounter.   COVID-19 Education: The signs and symptoms of COVID-19 were discussed with the patient and how to seek care for testing (follow up with PCP or arrange E-visit).  The importance of social distancing was discussed today.  Patient Risk:   After full review of this patients clinical status, I feel that they are at least moderate risk at this time.  Time:   Today, I have spent *** minutes/ seconds with the patient with telehealth technology discussing above diagnoses.     Medication Adjustments/Labs and Tests Ordered: Current medicines are reviewed at length with the patient today.  Concerns regarding medicines are outlined above.    Tests Ordered: No orders of the defined types were placed in this encounter.   Medication Changes: No orders of the defined types were placed in this encounter.   Disposition:  Follow up {follow up:15908}  Signed, Sheppard Evens [place], CMA

## 2021-05-19 NOTE — Progress Notes (Signed)
BHRT results not yet available. Pt not seen.   RS

## 2021-05-29 LAB — HM MAMMOGRAPHY

## 2021-06-01 ENCOUNTER — Encounter: Payer: Self-pay | Admitting: Internal Medicine

## 2021-06-12 ENCOUNTER — Telehealth: Payer: Self-pay | Admitting: Genetic Counselor

## 2021-06-12 NOTE — Telephone Encounter (Signed)
Scheduled appt per 3/15 referral. Pt is aware of appt date and time. Pt is aware to arrive 15 mins prior to appt time and to bring and updated insurance card. Pt is aware of appt location.   ?

## 2021-06-24 ENCOUNTER — Other Ambulatory Visit: Payer: Self-pay | Admitting: Internal Medicine

## 2021-07-14 ENCOUNTER — Encounter: Payer: Self-pay | Admitting: Genetic Counselor

## 2021-07-14 ENCOUNTER — Inpatient Hospital Stay: Payer: 59 | Attending: Genetic Counselor | Admitting: Genetic Counselor

## 2021-07-14 DIAGNOSIS — Z1379 Encounter for other screening for genetic and chromosomal anomalies: Secondary | ICD-10-CM

## 2021-07-14 DIAGNOSIS — Z85828 Personal history of other malignant neoplasm of skin: Secondary | ICD-10-CM

## 2021-07-14 NOTE — Progress Notes (Signed)
REFERRING PROVIDER: ?Law, Cassandra A, DO ?[address] ?[place],  [place] [address] ? ?PRIMARY PROVIDER:  ?Sanders, Robyn, MD ? ?PRIMARY REASON FOR VISIT:  ?Encounter Diagnosis  ?Name Primary?  ? Genetic testing Yes  ? ?HISTORY OF PRESENT ILLNESS:   ?Ms. Balbuena, a 50 y.o. female, was seen for a [facility] cancer genetics consultation at the request of Dr. Law to discuss her genetic testing results. Ms. Wickard has a personal history of basal cell carcinoma of the left breast at age 46. ? ?I connected with Ms. Crysler on 07/14/2021 at 1:00 EDT by Webex video conference and verified that I am speaking with the correct person using two identifiers.  ? ?Patient location: [place], [place] ?Provider location: [facility].  ? ? ?CANCER HISTORY:  ?Oncology History  ? No history exists.  ? ? ?RISK FACTORS:  ?Menarche was at age 11.  ?First live birth at age 24.  ?OCP use for approximately 20 years.  ?Ovaries intact: yes.  ?Uterus intact: no.  ?Menopausal status: postmenopausal.  ?HRT use: 0 years ?Colonoscopy: yes; she reports 6 polyps on her most recent colonoscopy ?Mammogram within the last year: yes. ?Number of breast biopsies: 0. ? ?Past Medical History:  ?Diagnosis Date  ? Asthma   ? no inhaler  ? Basal cell carcinoma (BCC) of female breast   ? left breast  ? Chest pain of uncertain etiology 03/09/2021  ? GERD (gastroesophageal reflux disease)   ? Hyperlipidemia 03/09/2021  ? Hypothyroidism   ? Seasonal allergies   ? Thyroid disease   ? ? ?Past Surgical History:  ?Procedure Laterality Date  ? AUGMENTATION MAMMAPLASTY    ? BILATERAL SALPINGECTOMY Bilateral 06/15/2012  ? Procedure: BILATERAL SALPINGECTOMY;  Surgeon: Richard J Taavon, MD;  Location: [facility];  Service: Gynecology;  Laterality: Bilateral;  BILATERAL SALPINGECTOMY  ? BREAST ENHANCEMENT SURGERY    ? CHOLECYSTECTOMY    ? CLAVICLE SURGERY    ? MASS EXCISION Left 11/03/2017  ? Procedure: EXCISION OF LEFT BREAST SKIN CANCER;  Surgeon: Dillingham,  Claire S, DO;  Location: [facility];  Service: Plastics;  Laterality: Left;  ? NASAL SINUS SURGERY    ? ROBOTIC ASSISTED TOTAL HYSTERECTOMY N/A 06/15/2012  ? Procedure: ROBOTIC ASSISTED TOTAL HYSTERECTOMY;  Surgeon: Richard J Taavon, MD;  Location: [facility];  Service: Gynecology;  Laterality: N/A;  ROBOTIC ASSISTED TOTAL HYSTERECTOMY  ? Small tummy tuck    ? July 22nd 2019 at [facility]  ? TONSILLECTOMY    ? ? ?Social History  ? ?Socioeconomic History  ? Marital status: Married  ?  Spouse name: Not on file  ? Number of children: Not on file  ? Years of education: Not on file  ? Highest education level: Associate degree: occupational, technical, or vocational program  ?Occupational History  ? Not on file  ?Tobacco Use  ? Smoking status: Never  ? Smokeless tobacco: Never  ?Vaping Use  ? Vaping Use: Never used  ?Substance and Sexual Activity  ? Alcohol use: Yes  ?  Comment: rarely  ? Drug use: No  ? Sexual activity: Yes  ?  Birth control/protection: Surgical  ?Other Topics Concern  ? Not on file  ?Social History Narrative  ? Not on file  ? ?Social Determinants of Health  ? ?Financial Resource Strain: Low Risk   ? Difficulty of Paying Living Expenses: Not hard at all  ?Food Insecurity: No Food Insecurity  ? Worried About Running Out of Food in   the Last Year: Never true  ? Ran Out of Food in the Last Year: Never true  ?Transportation Needs: No Transportation Needs  ? Lack of Transportation (Medical): No  ? Lack of Transportation (Non-Medical): No  ?Physical Activity: Sufficiently Active  ? Days of Exercise per Week: 5 days  ? Minutes of Exercise per Session: 60 min  ?Stress: No Stress Concern Present  ? Feeling of Stress : Not at all  ?Social Connections: Moderately Isolated  ? Frequency of Communication with Friends and Family: Once a week  ? Frequency of Social Gatherings with Friends and Family: Once a week  ? Attends Religious Services: More than 4 times per year  ? Active Member of Clubs or  Organizations: No  ? Attends Archivist Meetings: Not on file  ? Marital Status: Married  ?  ? ?FAMILY HISTORY:  ?We obtained a detailed, 4-generation family history.  Significant diagnoses are listed below: ?Family History  ?Problem Relation Age of Onset  ? Cancer Mother 82  ?     colorectal cancer  ? Bone cancer Father 30  ? Cervical cancer Sister   ?     dx. 78s  ? Skin cancer Maternal Aunt   ?     unknown type  ? Lung cancer Maternal Uncle   ?     smoked  ? Lung cancer Maternal Grandmother   ?     smoked  ? Lung cancer Maternal Grandfather   ?     smoked  ? Ovarian cancer Paternal Grandmother   ?     dx. 58s  ? Colon cancer Paternal Grandmother   ?     dx. 47s  ? Lung cancer Paternal Grandfather   ?     smoked  ? ? ? ? ?Ms. Burley's sister was diagnosed with cervical cancer in her 17s. One brother was diagnosed with melanoma at age 46 and a second brother was diagnosed with lung cancer at age 36. Her mother was diagnosed with anal cancer at age 19 and died due to cancer metastasis at age 4, she smoked. One maternal aunt was diagnosed with an unknown type of skin cancer and one maternal uncle was diagnosed with lung cancer, he smoked. Her maternal grandmother was diagnosed with lung cancer, she smoked. Her maternal grandfather was diagnosed with lung cancer, he smoked.  ? ?Ms. Stroebel's father was diagnosed with bone cancer at age 72, he is currently 49. Her paternal grandmother was diagnosed with ovarian and colon cancer in her early 57s and died shortly after her diagnosis. Her paternal grandfather was diagnosed with lung cancer, he smoked. ? ?Ms. Tinner is unaware of previous family history of genetic testing for hereditary cancer risks.  ? ?GENETIC COUNSELING ASSESSMENT:  ?Ms. Vandeventer already had genetic testing. Therefore, we reviewed her results during today's session.  ? ?GENETIC TEST RESULTS:  ?The [facility] Multi-Cancer Panel found no pathogenic mutations.  ? ?The Multi-Cancer Panel offered by  [facility] includes sequencing and/or deletion/duplication analysis of the following 84 genes:  AIP, ALK, APC, ATM, AXIN2, BAP1, BARD1, BLM, BMPR1A, BRCA1, BRCA2, BRIP1, CASR, CDC73, CDH1, CDK4, CDKN1B, CDKN1C, CDKN2A, CEBPA, CHEK2, CTNNA1, DICER1, DIS3L2, EGFR, EPCAM, FH, FLCN, GATA2, GPC3, GREM1, HOXB13, HRAS, KIT, MAX, MEN1, MET, MITF, MLH1, MSH2, MSH3, MSH6, MUTYH, NBN, NF1, NF2, NTHL1, PALB2, PDGFRA, PHOX2B, PMS2, POLD1, POLE, POT1, PRKAR1A, PTCH1, PTEN, RAD50, RAD51C, RAD51D, RB1, RECQL4, RET, RUNX1, SDHA, SDHAF2, SDHB, SDHC, SDHD, SMAD4, SMARCA4, SMARCB1, SMARCE1, STK11, SUFU, TERC, TERT, TMEM127, TP53,  TSC1, TSC2, VHL, WRN, and WT1.   ? ? ? ? ? ?Genetic testing identified two variants of uncertain significance (VUS) in the APC gene. At this time, it is unknown if the variants are associated with an increased risk for cancer or if they are benign, but most uncertain variants are reclassified to benign. It should not be used to make medical management decisions. With time, we suspect the laboratory will determine the significance of the variants, if any.  ? ?Even though a pathogenic variant was not identified, possible explanations for the cancer in the family may include: ?There may be no hereditary risk for cancer in the family. The cancers her family may be due to other genetic or environmental factors. ?There may be a gene mutation in one of these genes that current testing methods cannot detect, but that chance is small. ?There could be another gene that has not yet been discovered, or that we have not yet tested, that is responsible for the cancer diagnoses in the family.  ?It is also possible there is a hereditary cause for the cancer in the family that Ms. Ashenfelter did not inherit. ?The variant of uncertain significance detected in the APC gene may be reclassified as a pathogenic variant in the future. At this time, we do not know if the variants increases the risk for cancer. ? ?Therefore, it is important  to remain in touch with cancer genetics in the future so that we can continue to offer Ms. Moskowitz the most up to date genetic testing.  ? ?ADDITIONAL GENETIC TESTING:  ?We discussed with Ms. Lech that her geneti

## 2021-08-17 ENCOUNTER — Other Ambulatory Visit (HOSPITAL_COMMUNITY): Payer: Self-pay

## 2021-08-17 MED ORDER — SEMAGLUTIDE-WEIGHT MANAGEMENT 0.25 MG/0.5ML ~~LOC~~ SOAJ
0.2500 mg | SUBCUTANEOUS | 0 refills | Status: DC
Start: 2021-08-13 — End: 2022-01-28
  Filled 2021-08-17: qty 2, 28d supply, fill #0

## 2021-08-18 ENCOUNTER — Other Ambulatory Visit (HOSPITAL_COMMUNITY): Payer: Self-pay

## 2021-08-18 MED ORDER — WEGOVY 0.25 MG/0.5ML ~~LOC~~ SOAJ
0.5000 mL | SUBCUTANEOUS | 6 refills | Status: DC
  Filled 2021-08-18: qty 2, 28d supply, fill #0

## 2021-08-24 ENCOUNTER — Other Ambulatory Visit: Payer: Self-pay | Admitting: Internal Medicine

## 2021-09-09 ENCOUNTER — Ambulatory Visit (INDEPENDENT_AMBULATORY_CARE_PROVIDER_SITE_OTHER): Payer: 59 | Admitting: Neurology

## 2021-09-09 ENCOUNTER — Encounter: Payer: Self-pay | Admitting: Neurology

## 2021-09-09 VITALS — BP 125/87 | HR 85 | Ht 67.5 in | Wt 166.0 lb

## 2021-09-09 DIAGNOSIS — R9082 White matter disease, unspecified: Secondary | ICD-10-CM | POA: Diagnosis not present

## 2021-09-09 DIAGNOSIS — M542 Cervicalgia: Secondary | ICD-10-CM | POA: Diagnosis not present

## 2021-09-09 DIAGNOSIS — R200 Anesthesia of skin: Secondary | ICD-10-CM

## 2021-09-09 MED ORDER — CYCLOBENZAPRINE HCL 10 MG PO TABS: 10.0000 mg | ORAL_TABLET | Freq: Three times a day (TID) | ORAL | 1 refills | Status: DC | PRN
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
## 2021-09-16 ENCOUNTER — Other Ambulatory Visit: Payer: Self-pay | Admitting: Internal Medicine

## 2021-09-22 ENCOUNTER — Ambulatory Visit: Payer: 59 | Admitting: Neurology

## 2021-09-25 ENCOUNTER — Other Ambulatory Visit: Payer: Self-pay | Admitting: Internal Medicine

## 2021-11-11 ENCOUNTER — Encounter: Payer: 59 | Admitting: Internal Medicine

## 2021-11-11 NOTE — Progress Notes (Deleted)
I,Imani Sherrin T Brandell Maready,acting as a scribe for Maximino Greenland, MD.,have documented all relevant documentation on the behalf of Maximino Greenland, MD,as directed by  Maximino Greenland, MD while in the presence of Maximino Greenland, MD.   Subjective:     Patient ID: Kristy Walter , female    DOB: [birth year] , 51 y.o.   MRN: [id number]   Chief Complaint  Patient presents with   Annual Exam    HPI  Patient here for HM.  She is followed by GYN for her pelvic exams. She is followed by [facility].      Past Medical History:  Diagnosis Date   Asthma    no inhaler   Basal cell carcinoma (BCC) of female breast    left breast   Chest pain of uncertain etiology 62/26/3335   GERD (gastroesophageal reflux disease)    Hyperlipidemia 03/09/2021   Hypothyroidism    Seasonal allergies    Thyroid disease      Family History  Problem Relation Age of Onset   Cancer Mother 71       colorectal cancer   Diabetes Father    Bone cancer Father 84   Cervical cancer Sister        dx. 30s   Allergic rhinitis Brother    Heart attack Maternal Aunt    Skin cancer Maternal Aunt        unknown type   Heart attack Maternal Uncle    Lung cancer Maternal Uncle        smoked   Heart failure Maternal Grandmother    Lung cancer Maternal Grandmother        smoked   Heart attack Maternal Grandfather    Lung cancer Maternal Grandfather        smoked   Ovarian cancer Paternal Grandmother        dx. 50s   Colon cancer Paternal Grandmother        dx. 32s   Lung cancer Paternal Grandfather        smoked   Allergic rhinitis Son      Current Outpatient Medications:    cetirizine (ZYRTEC) 10 MG tablet, Take 1 tablet (10 mg total) by mouth 2 (two) times daily as needed for allergies (Can take an extra dose during flare ups)., Disp: 30 tablet, Rfl: 5   cyclobenzaprine (FLEXERIL) 10 MG tablet, Take 1 tablet (10 mg total) by mouth 3 (three) times daily as needed for muscle spasms., Disp: 90 tablet, Rfl:  1   levothyroxine (SYNTHROID) 75 MCG tablet, 1 tablet in the morning on an empty stomach, Disp: , Rfl:    montelukast (SINGULAIR) 10 MG tablet, TAKE ONE TABLET ONCE DAILY AS DIRECTED., Disp: 90 tablet, Rfl: 1   Olopatadine HCl (PATADAY) 0.2 % SOLN, Place 1 drop into both eyes 1 day or 1 dose., Disp: 2.5 mL, Rfl: 5   omeprazole (PRILOSEC) 40 MG capsule, Take 1 capsule (40 mg total) by mouth every morning., Disp: 30 capsule, Rfl: 5   OZEMPIC, 0.25 OR 0.5 MG/DOSE, 2 MG/3ML SOPN, Inject into the skin once a week., Disp: , Rfl:    pantoprazole (PROTONIX) 40 MG tablet, TAKE 1 TABLET BY MOUTH EVERY DAY, Disp: 90 tablet, Rfl: 0   phentermine 30 MG capsule, Take 30 mg by mouth daily., Disp: , Rfl:    Semaglutide-Weight Management 0.25 MG/0.5ML SOAJ, Inject 0.25 mg into the skin once a week., Disp: 2 mL, Rfl: 0   tretinoin (RETIN-A) 0.05 %  cream, Apply a small amount (pea size) twice a week on face and neck, Disp: , Rfl:    Vitamin D, Ergocalciferol, (DRISDOL) 1.25 MG (50000 UNIT) CAPS capsule, Take 50,000 Units by mouth once a week., Disp: , Rfl:    Allergies  Allergen Reactions   Keflex [Cephalexin] Anaphylaxis    She swells and get hives everywhere 04/17/21-She reports she took this medication recently without any issues. She would like chart updated that she is not allergic to Augmentin.   Soy Allergy Other (See Comments) and Anaphylaxis    Upset stomach GI upset/pain   Sulfa Antibiotics Anaphylaxis    Pt states she can not breath   Sulfamethoxazole Anaphylaxis   Trimethoprim Anaphylaxis   Latex Hives   Other Nausea And Vomiting    Bananas and Egg whites   Sulfamethoxazole-Trimethoprim Rash      The patient states she uses {contraceptive methods:5051} for birth control. Last LMP was Patient's last menstrual period was 05/31/2012.. {Dysmenorrhea-menorrhagia:21918}. Negative for: breast discharge, breast lump(s), breast pain and breast self exam. Associated symptoms include abnormal vaginal  bleeding. Pertinent negatives include abnormal bleeding (hematology), anxiety, decreased libido, depression, difficulty falling sleep, dyspareunia, history of infertility, nocturia, sexual dysfunction, sleep disturbances, urinary incontinence, urinary urgency, vaginal discharge and vaginal itching. Diet regular.The patient states her exercise level is    . The patient's tobacco use is:  Social History   Tobacco Use  Smoking Status Never  Smokeless Tobacco Never  . She has been exposed to passive smoke. The patient's alcohol use is:  Social History   Substance and Sexual Activity  Alcohol Use Yes   Comment: rarely  . Additional information: Last pap ***, next one scheduled for ***.    Review of Systems  Constitutional: Negative.   HENT: Negative.    Eyes: Negative.   Respiratory: Negative.    Cardiovascular: Negative.   Gastrointestinal: Negative.   Endocrine: Negative.   Genitourinary: Negative.   Musculoskeletal: Negative.   Skin: Negative.   Allergic/Immunologic: Negative.   Neurological: Negative.   Hematological: Negative.   Psychiatric/Behavioral: Negative.       There were no vitals filed for this visit. There is no height or weight on file to calculate BMI.   Objective:  Physical Exam      Assessment And Plan:     There are no diagnoses linked to this encounter.    Patient was given opportunity to ask questions. Patient verbalized understanding of the plan and was able to repeat key elements of the plan. All questions were answered to their satisfaction.   Debbora Dus, CMA   I, Debbora Dus, CMA, have reviewed all documentation for this visit. The documentation on 11/11/21 for the exam, diagnosis, procedures, and orders are all accurate and complete.   THE PATIENT IS ENCOURAGED TO PRACTICE SOCIAL DISTANCING DUE TO THE COVID-19 PANDEMIC.

## 2021-11-11 NOTE — Progress Notes (Signed)
No show

## 2021-12-03 ENCOUNTER — Encounter: Payer: Self-pay | Admitting: Internal Medicine

## 2021-12-03 ENCOUNTER — Telehealth: Payer: 59 | Admitting: Family Medicine

## 2021-12-03 DIAGNOSIS — B9689 Other specified bacterial agents as the cause of diseases classified elsewhere: Secondary | ICD-10-CM | POA: Diagnosis not present

## 2021-12-03 DIAGNOSIS — R051 Acute cough: Secondary | ICD-10-CM | POA: Diagnosis not present

## 2021-12-03 DIAGNOSIS — J019 Acute sinusitis, unspecified: Secondary | ICD-10-CM

## 2021-12-03 MED ORDER — BENZONATATE 100 MG PO CAPS: 100.0000 mg | ORAL_CAPSULE | Freq: Three times a day (TID) | ORAL | 0 refills | Status: DC | PRN

## 2021-12-03 MED ORDER — PREDNISONE 10 MG (21) PO TBPK: ORAL_TABLET | ORAL | 0 refills | Status: DC

## 2021-12-03 MED ORDER — AMOXICILLIN-POT CLAVULANATE 875-125 MG PO TABS: 1.0000 | ORAL_TABLET | Freq: Two times a day (BID) | ORAL | 0 refills | Status: AC

## 2021-12-03 NOTE — Patient Instructions (Addendum)
Doylene Canning, thank you for joining Perlie Mayo, NP for today's virtual visit.  While this provider is not your primary care provider (PCP), if your PCP is located in our provider database this encounter information will be shared with them immediately following your visit.  Consent: (Patient) Kristy Walter provided verbal consent for this virtual visit at the beginning of the encounter.  Current Medications:  Current Outpatient Medications:    amoxicillin-clavulanate (AUGMENTIN) 875-125 MG tablet, Take 1 tablet by mouth 2 (two) times daily for 10 days., Disp: 20 tablet, Rfl: 0   benzonatate (TESSALON) 100 MG capsule, Take 1 capsule (100 mg total) by mouth 3 (three) times daily as needed for cough., Disp: 20 capsule, Rfl: 0   predniSONE (STERAPRED UNI-PAK 21 TAB) 10 MG (21) TBPK tablet, Take as directed, Disp: 21 tablet, Rfl: 0   cetirizine (ZYRTEC) 10 MG tablet, Take 1 tablet (10 mg total) by mouth 2 (two) times daily as needed for allergies (Can take an extra dose during flare ups)., Disp: 30 tablet, Rfl: 5   cyclobenzaprine (FLEXERIL) 10 MG tablet, Take 1 tablet (10 mg total) by mouth 3 (three) times daily as needed for muscle spasms., Disp: 90 tablet, Rfl: 1   levothyroxine (SYNTHROID) 75 MCG tablet, 1 tablet in the morning on an empty stomach, Disp: , Rfl:    montelukast (SINGULAIR) 10 MG tablet, TAKE ONE TABLET ONCE DAILY AS DIRECTED., Disp: 90 tablet, Rfl: 1   Olopatadine HCl (PATADAY) 0.2 % SOLN, Place 1 drop into both eyes 1 day or 1 dose., Disp: 2.5 mL, Rfl: 5   omeprazole (PRILOSEC) 40 MG capsule, Take 1 capsule (40 mg total) by mouth every morning., Disp: 30 capsule, Rfl: 5   OZEMPIC, 0.25 OR 0.5 MG/DOSE, 2 MG/3ML SOPN, Inject into the skin once a week., Disp: , Rfl:    pantoprazole (PROTONIX) 40 MG tablet, TAKE 1 TABLET BY MOUTH EVERY DAY, Disp: 90 tablet, Rfl: 0   phentermine 30 MG capsule, Take 30 mg by mouth daily., Disp: , Rfl:    Semaglutide-Weight Management 0.25  MG/0.5ML SOAJ, Inject 0.25 mg into the skin once a week., Disp: 2 mL, Rfl: 0   tretinoin (RETIN-A) 0.05 % cream, Apply a small amount (pea size) twice a week on face and neck, Disp: , Rfl:    Vitamin D, Ergocalciferol, (DRISDOL) 1.25 MG (50000 UNIT) CAPS capsule, Take 50,000 Units by mouth once a week., Disp: , Rfl:    Medications ordered in this encounter:  Meds ordered this encounter  Medications   amoxicillin-clavulanate (AUGMENTIN) 875-125 MG tablet    Sig: Take 1 tablet by mouth 2 (two) times daily for 10 days.    Dispense:  20 tablet    Refill:  0    Order Specific Question:   Supervising Provider    Answer:   Sabra Heck, BRIAN [[id number]]   predniSONE (STERAPRED UNI-PAK 21 TAB) 10 MG (21) TBPK tablet    Sig: Take as directed    Dispense:  21 tablet    Refill:  0    Order Specific Question:   Supervising Provider    Answer:   MILLER, BRIAN [[id number]]   benzonatate (TESSALON) 100 MG capsule    Sig: Take 1 capsule (100 mg total) by mouth 3 (three) times daily as needed for cough.    Dispense:  20 capsule    Refill:  0    Order Specific Question:   Supervising Provider    Answer:   Noemi Chapel [  [id number]]     *If you need refills on other medications prior to your next appointment, please contact your pharmacy*  Follow-Up: Call back or seek an in-person evaluation if the symptoms worsen or if the condition fails to improve as anticipated.  Other Instructions Secondary infection post covid  -prednisone and perles to help with cough -continue mucinex -hydrate and rest     If you have been instructed to have an in-person evaluation today at a local Urgent Care facility, please use the link below. It will take you to a list of all of our available [facility], including address, phone number and hours of operation. Please do not delay care.  [facility]  If you or a family member do not have a primary care provider, use the link below to schedule a visit and  establish care. When you choose a [facility] primary care physician or advanced practice provider, you gain a long-term partner in health. Find a Primary Care Provider  Learn more about [facility]'s in-office and virtual care options: [facility]

## 2021-12-03 NOTE — Progress Notes (Signed)
Virtual Visit Consent   Kristy Walter, you are scheduled for a virtual visit with a [place] provider today. Just as with appointments in the office, your consent must be obtained to participate. Your consent will be active for this visit and any virtual visit you may have with one of our providers in the next 365 days. If you have a MyChart account, a copy of this consent can be sent to you electronically.  As this is a virtual visit, video technology does not allow for your provider to perform a traditional examination. This may limit your provider's ability to fully assess your condition. If your provider identifies any concerns that need to be evaluated in person or the need to arrange testing (such as labs, EKG, etc.), we will make arrangements to do so. Although advances in technology are sophisticated, we cannot ensure that it will always work on either your end or our end. If the connection with a video visit is poor, the visit may have to be switched to a telephone visit. With either a video or telephone visit, we are not always able to ensure that we have a secure connection.  By engaging in this virtual visit, you consent to the provision of healthcare and authorize for your insurance to be billed (if applicable) for the services provided during this visit. Depending on your insurance coverage, you may receive a charge related to this service.  I need to obtain your verbal consent now. Are you willing to proceed with your visit today? ALLEAN MONTFORT has provided verbal consent on 12/03/2021 for a virtual visit (video or telephone). Perlie Mayo, NP  Date: 12/03/2021 10:38 AM  Virtual Visit via Video Note   I, Perlie Mayo, connected with  Kristy Walter  ([id number], 18-Jan-1971) on 12/03/21 at 10:45 AM EDT by a video-enabled telemedicine application and verified that I am speaking with the correct person using two identifiers.  Location: Patient: Virtual Visit Location Patient:  Home Provider: Virtual Visit Location Provider: Home Office   I discussed the limitations of evaluation and management by telemedicine and the availability of in person appointments. The patient expressed understanding and agreed to proceed.    History of Present Illness: Kristy Walter is a 51 y.o. who identifies as a female who was assigned female at birth, and is being seen today for persistent cough with mucus production.  Tested positive for Covid on 11/23/2021. Treated with over the counter Oscillococcinum, Vitamin C Daily Immune Support, daily cold medication and Mucinex.    Today reports struggling to sleep with cough. Constant amount of spongy mucus dark green in color coming from my chest, a large amount of thick mucus yellow in color in my sinus. Has continue to use mucinex notes it does bring up more mucus when taking it- was unsure if they was making her worse or not. Denies fevers, chills, ear pain, chest pain, shortness of breath.   Problems:  Patient Active Problem List   Diagnosis Date Noted   Genetic testing 07/14/2021   Chest pain of uncertain etiology 38/18/2993   Hyperlipidemia 03/09/2021   Primary hypothyroidism 03/08/2018   Palpitations 03/08/2018   Female climacteric state 03/08/2018   Menopausal symptoms 11/16/2017   Polydipsia 11/16/2017   Status post complete hysterectomy 06/15/2012    Allergies:  Allergies  Allergen Reactions   Keflex [Cephalexin] Anaphylaxis    She swells and get hives everywhere 04/17/21-She reports she took this medication recently without any issues. She  would like chart updated that she is not allergic to Augmentin.   Soy Allergy Other (See Comments) and Anaphylaxis    Upset stomach GI upset/pain   Sulfa Antibiotics Anaphylaxis    Pt states she can not breath   Sulfamethoxazole Anaphylaxis   Trimethoprim Anaphylaxis   Latex Hives   Other Nausea And Vomiting    Bananas and Egg whites   Sulfamethoxazole-Trimethoprim Rash    Medications:  Current Outpatient Medications:    cetirizine (ZYRTEC) 10 MG tablet, Take 1 tablet (10 mg total) by mouth 2 (two) times daily as needed for allergies (Can take an extra dose during flare ups)., Disp: 30 tablet, Rfl: 5   cyclobenzaprine (FLEXERIL) 10 MG tablet, Take 1 tablet (10 mg total) by mouth 3 (three) times daily as needed for muscle spasms., Disp: 90 tablet, Rfl: 1   levothyroxine (SYNTHROID) 75 MCG tablet, 1 tablet in the morning on an empty stomach, Disp: , Rfl:    montelukast (SINGULAIR) 10 MG tablet, TAKE ONE TABLET ONCE DAILY AS DIRECTED., Disp: 90 tablet, Rfl: 1   Olopatadine HCl (PATADAY) 0.2 % SOLN, Place 1 drop into both eyes 1 day or 1 dose., Disp: 2.5 mL, Rfl: 5   omeprazole (PRILOSEC) 40 MG capsule, Take 1 capsule (40 mg total) by mouth every morning., Disp: 30 capsule, Rfl: 5   OZEMPIC, 0.25 OR 0.5 MG/DOSE, 2 MG/3ML SOPN, Inject into the skin once a week., Disp: , Rfl:    pantoprazole (PROTONIX) 40 MG tablet, TAKE 1 TABLET BY MOUTH EVERY DAY, Disp: 90 tablet, Rfl: 0   phentermine 30 MG capsule, Take 30 mg by mouth daily., Disp: , Rfl:    Semaglutide-Weight Management 0.25 MG/0.5ML SOAJ, Inject 0.25 mg into the skin once a week., Disp: 2 mL, Rfl: 0   tretinoin (RETIN-A) 0.05 % cream, Apply a small amount (pea size) twice a week on face and neck, Disp: , Rfl:    Vitamin D, Ergocalciferol, (DRISDOL) 1.25 MG (50000 UNIT) CAPS capsule, Take 50,000 Units by mouth once a week., Disp: , Rfl:   Observations/Objective: Patient is well-developed, well-nourished in no acute distress.  Resting comfortably  at home.  Head is normocephalic, atraumatic.  No labored breathing.  Speech is clear and coherent with logical content.  Patient is alert and oriented at baseline.  Noted cough present during video visit  Assessment and Plan:  1. Acute bacterial sinusitis  - amoxicillin-clavulanate (AUGMENTIN) 875-125 MG tablet; Take 1 tablet by mouth 2 (two) times daily for 10  days.  Dispense: 20 tablet; Refill: 0 - predniSONE (STERAPRED UNI-PAK 21 TAB) 10 MG (21) TBPK tablet; Take as directed  Dispense: 21 tablet; Refill: 0  2. Acute cough  - predniSONE (STERAPRED UNI-PAK 21 TAB) 10 MG (21) TBPK tablet; Take as directed  Dispense: 21 tablet; Refill: 0 - benzonatate (TESSALON) 100 MG capsule; Take 1 capsule (100 mg total) by mouth 3 (three) times daily as needed for cough.  Dispense: 20 capsule; Refill: 0   -secondary infection post covid -will treat with above with strict in person precautions for risk of PNA given duration and system she is reporting. -no red flags today  -prednisone and perles to help with cough -continue mucinex -hydrate and rest   Reviewed side effects, risks and benefits of medication.   Patient acknowledged agreement and understanding of the plan.   Past Medical, Surgical, Social History, Allergies, and Medications have been Reviewed.    Follow Up Instructions: I discussed the assessment and treatment plan  with the patient. The patient was provided an opportunity to ask questions and all were answered. The patient agreed with the plan and demonstrated an understanding of the instructions.  A copy of instructions were sent to the patient via MyChart unless otherwise noted below.     The patient was advised to call back or seek an in-person evaluation if the symptoms worsen or if the condition fails to improve as anticipated.  Time:  I spent 10 minutes with the patient via telehealth technology discussing the above problems/concerns.    Perlie Mayo, NP

## 2022-01-28 ENCOUNTER — Encounter: Payer: Self-pay | Admitting: Internal Medicine

## 2022-01-28 ENCOUNTER — Ambulatory Visit (INDEPENDENT_AMBULATORY_CARE_PROVIDER_SITE_OTHER): Payer: 59 | Admitting: Internal Medicine

## 2022-01-28 VITALS — BP 122/64 | HR 86 | Temp 98.2°F | Ht 67.0 in | Wt 162.6 lb

## 2022-01-28 DIAGNOSIS — R5383 Other fatigue: Secondary | ICD-10-CM | POA: Diagnosis not present

## 2022-01-28 DIAGNOSIS — R109 Unspecified abdominal pain: Secondary | ICD-10-CM

## 2022-01-28 DIAGNOSIS — E039 Hypothyroidism, unspecified: Secondary | ICD-10-CM | POA: Diagnosis not present

## 2022-01-28 DIAGNOSIS — Z23 Encounter for immunization: Secondary | ICD-10-CM

## 2022-01-28 DIAGNOSIS — Z0000 Encounter for general adult medical examination without abnormal findings: Secondary | ICD-10-CM

## 2022-01-28 DIAGNOSIS — R300 Dysuria: Secondary | ICD-10-CM

## 2022-01-28 DIAGNOSIS — Z79899 Other long term (current) drug therapy: Secondary | ICD-10-CM

## 2022-01-28 DIAGNOSIS — R7309 Other abnormal glucose: Secondary | ICD-10-CM

## 2022-01-28 MED ORDER — NITROFURANTOIN MONOHYD MACRO 100 MG PO CAPS: 100.0000 mg | ORAL_CAPSULE | Freq: Two times a day (BID) | ORAL | 0 refills | Status: AC

## 2022-01-28 MED ORDER — CYANOCOBALAMIN 1000 MCG/ML IJ SOLN
1000.0000 ug | Freq: Once | INTRAMUSCULAR | Status: AC
  Administered 2022-01-28: 1000 ug via INTRAMUSCULAR

## 2022-01-28 NOTE — Progress Notes (Signed)
Barnet Glasgow Martin,acting as a Education administrator for Maximino Greenland, MD.,have documented all relevant documentation on the behalf of Maximino Greenland, MD,as directed by  Maximino Greenland, MD while in the presence of Maximino Greenland, MD.   Subjective:     Patient ID: Kristy Walter , female    DOB: 06/08/1970 , 51 y.o.   MRN: [id number]   Chief Complaint  Patient presents with   Annual Exam    HPI  Patient presents today for HM.  She is followed by [facility] for her pelvic exams.   Patient states she is having some stomach issues but she thinks its due to stress. She think she has a stress ulcer. Patient also thinks she has a UTI.  She c/o pelvic pain, urgency and burning.   BP Readings from Last 3 Encounters: 01/28/22 : 122/64 09/09/21 : 125/87 04/09/21 : 112/84       Past Medical History:  Diagnosis Date   Asthma    no inhaler   Basal cell carcinoma (BCC) of female breast    left breast   Chest pain of uncertain etiology 41/32/4401   GERD (gastroesophageal reflux disease)    Hyperlipidemia 03/09/2021   Hypothyroidism    Seasonal allergies    Thyroid disease      Family History  Problem Relation Age of Onset   Cancer Mother 38       colorectal cancer   Diabetes Father    Bone cancer Father 53   Cervical cancer Sister        dx. 30s   Allergic rhinitis Brother    Heart attack Maternal Aunt    Skin cancer Maternal Aunt        unknown type   Heart attack Maternal Uncle    Lung cancer Maternal Uncle        smoked   Heart failure Maternal Grandmother    Lung cancer Maternal Grandmother        smoked   Heart attack Maternal Grandfather    Lung cancer Maternal Grandfather        smoked   Ovarian cancer Paternal Grandmother        dx. 46s   Colon cancer Paternal Grandmother        dx. 48s   Lung cancer Paternal Grandfather        smoked   Allergic rhinitis Son      Current Outpatient Medications:    cetirizine (ZYRTEC) 10 MG tablet, Take 1 tablet (10 mg  total) by mouth 2 (two) times daily as needed for allergies (Can take an extra dose during flare ups)., Disp: 30 tablet, Rfl: 5   cyclobenzaprine (FLEXERIL) 10 MG tablet, Take 1 tablet (10 mg total) by mouth 3 (three) times daily as needed for muscle spasms., Disp: 90 tablet, Rfl: 1   levothyroxine (SYNTHROID) 75 MCG tablet, 1 tablet in the morning on an empty stomach, Disp: , Rfl:    montelukast (SINGULAIR) 10 MG tablet, TAKE ONE TABLET ONCE DAILY AS DIRECTED., Disp: 90 tablet, Rfl: 1   Olopatadine HCl (PATADAY) 0.2 % SOLN, Place 1 drop into both eyes 1 day or 1 dose., Disp: 2.5 mL, Rfl: 5   OZEMPIC, 0.25 OR 0.5 MG/DOSE, 2 MG/3ML SOPN, Inject into the skin once a week., Disp: , Rfl:    pantoprazole (PROTONIX) 40 MG tablet, TAKE 1 TABLET BY MOUTH EVERY DAY, Disp: 90 tablet, Rfl: 0   tretinoin (RETIN-A) 0.05 % cream, Apply a small amount (  pea size) twice a week on face and neck, Disp: , Rfl:    Vitamin D, Ergocalciferol, (DRISDOL) 1.25 MG (50000 UNIT) CAPS capsule, Take 50,000 Units by mouth once a week., Disp: , Rfl:    Allergies  Allergen Reactions   Keflex [Cephalexin] Anaphylaxis    She swells and get hives everywhere 04/17/21-She reports she took this medication recently without any issues. She would like chart updated that she is not allergic to Augmentin.   Soy Allergy Other (See Comments) and Anaphylaxis    Upset stomach GI upset/pain   Sulfa Antibiotics Anaphylaxis    Pt states she can not breath   Sulfamethoxazole Anaphylaxis   Trimethoprim Anaphylaxis   Latex Hives   Other Nausea And Vomiting    Bananas and Egg whites   Sulfamethoxazole-Trimethoprim Rash    The patient states she uses post menopausal status for birth control. Last LMP was Patient's last menstrual period was 05/31/2012.. Negative for Dysmenorrhea. Negative for: breast discharge, breast lump(s), breast pain and breast self exam. Associated symptoms include abnormal vaginal bleeding. Pertinent negatives include  abnormal bleeding (hematology), anxiety, decreased libido, depression, difficulty falling sleep, dyspareunia, history of infertility, nocturia, sexual dysfunction, sleep disturbances, urinary incontinence, urinary urgency, vaginal discharge and vaginal itching. Diet regular.The patient states her exercise level is    . The patient's tobacco use is:  Social History   Tobacco Use  Smoking Status Never  Smokeless Tobacco Never  . She has been exposed to passive smoke. The patient's alcohol use is:  Social History   Substance and Sexual Activity  Alcohol Use Yes   Comment: rarely   Review of Systems  Constitutional: Negative.   HENT: Negative.    Eyes: Negative.   Respiratory: Negative.    Cardiovascular: Negative.   Gastrointestinal:  Positive for abdominal pain.       She states she has a stomach ache every time she eats, this started about four months ago. She denies prior h/o IBS. It is located in the middle of her stomach.   Endocrine: Negative.   Genitourinary:  Positive for dysuria.       She had dysuria today. Felt a little "zing" when she awakened today. She does urinate after intercourse.   Musculoskeletal: Negative.   Skin: Negative.   Allergic/Immunologic: Negative.   Neurological: Negative.   Hematological: Negative.   Psychiatric/Behavioral: Negative.       Today's Vitals   01/28/22 1616  BP: 122/64  Pulse: 86  Temp: 98.2 F (36.8 C)  TempSrc: Oral  Weight: 162 lb 9.6 oz (73.8 kg)  Height: _0  (1.702 m)  PainSc: 0-No pain   Body mass index is 25.47 kg/m.  Wt Readings from Last 3 Encounters:  01/28/22 162 lb 9.6 oz (73.8 kg)  09/09/21 166 lb (75.3 kg)  04/09/21 163 lb 3.2 oz (74 kg)    Objective:  Physical Exam Vitals and nursing note reviewed.  Constitutional:      Appearance: Normal appearance.  HENT:     Head: Normocephalic and atraumatic.     Right Ear: Tympanic membrane, ear canal and external ear normal.     Left Ear: Tympanic membrane, ear  canal and external ear normal.     Nose:     Comments: Masked     Mouth/Throat:     Comments: Masked  Eyes:     General:        Right eye: Right eye discharge: h.     Extraocular Movements: Extraocular movements intact.  Conjunctiva/sclera: Conjunctivae normal.     Pupils: Pupils are equal, round, and reactive to light.  Cardiovascular:     Rate and Rhythm: Normal rate and regular rhythm.     Pulses: Normal pulses.     Heart sounds: Normal heart sounds.  Pulmonary:     Effort: Pulmonary effort is normal.     Breath sounds: Normal breath sounds.  Chest:  Breasts:    Tanner Score is 5.     Right: Normal.     Left: Normal.     Comments: Healed surgical scars b/l Abdominal:     General: Abdomen is flat. Bowel sounds are normal.     Palpations: Abdomen is soft.  Genitourinary:    Comments: deferred Musculoskeletal:        General: Normal range of motion.     Cervical back: Normal range of motion and neck supple.  Skin:    General: Skin is warm and dry.  Neurological:     General: No focal deficit present.     Mental Status: She is alert and oriented to person, place, and time.  Psychiatric:        Mood and Affect: Mood normal.        Behavior: Behavior normal.      Assessment And Plan:     1. Annual physical exam Comments: A full exam was performed. Importance of monthly self breast exams was discussed with the patient. She will continue to have pelvic exams w/ GYN.  PATIENT IS ADVISED TO GET 30-45 MINUTES REGULAR EXERCISE NO LESS THAN FOUR TO FIVE DAYS PER WEEK - BOTH WEIGHTBEARING EXERCISES AND AEROBIC ARE RECOMMENDED.  PATIENT IS ADVISED TO FOLLOW A HEALTHY DIET WITH AT LEAST SIX FRUITS/VEGGIES PER DAY, DECREASE INTAKE OF RED MEAT, AND TO INCREASE FISH INTAKE TO TWO DAYS PER WEEK.  MEATS/FISH SHOULD NOT BE FRIED, BAKED OR BROILED IS PREFERABLE.  IT IS ALSO IMPORTANT TO CUT BACK ON YOUR SUGAR INTAKE. PLEASE AVOID ANYTHING WITH ADDED SUGAR, CORN SYRUP OR OTHER SWEETENERS.  IF YOU MUST USE A SWEETENER, YOU CAN TRY STEVIA. IT IS ALSO IMPORTANT TO AVOID ARTIFICIALLY SWEETENERS AND DIET BEVERAGES. LASTLY, I SUGGEST WEARING SPF 50 SUNSCREEN ON EXPOSED PARTS AND ESPECIALLY WHEN IN THE DIRECT SUNLIGHT FOR AN EXTENDED PERIOD OF TIME.  PLEASE AVOID FAST FOOD RESTAURANTS AND INCREASE YOUR WATER INTAKE. - CBC no Diff - CMP14+EGFR - Hemoglobin A1c - Lipid panel - Flu Vaccine QUAD 6+ mos PF IM (Fluarix Quad PF) - Insulin, random(561)  2. Primary hypothyroidism Comments: Chronic, currently followed by Dr. Chalmers Cater.  She is currently on levothyroxine 26mg daily. - TSH + free T4  3. Dysuria Comments: Urinalysis is suggestive of UTI. I will send rx abx, encouraged to take the full course and to stay well hydrated. - nitrofurantoin, macrocrystal-monohydrate, (MACROBID) 100 MG capsule; Take 1 capsule (100 mg total) by mouth 2 (two) times daily for 5 days.  Dispense: 10 capsule; Refill: 0 - Culture, Urine  4. Abdominal pain, unspecified abdominal location Comments: I will treat UTI. If persistent, will consider imaging.  She will also see GI for further evaluation/reatment.  5. Other fatigue - cyanocobalamin (VITAMIN B12) injection 1,000 mcg  6. Other abnormal glucose Comments: Her a1c has been elevated in the past. I will recheck this today. She is encouraged to limit her intake of sugary beverages and foods. - Hemoglobin A1c  7. Pharmacologic therapy - Vitamin B12  8. Need for influenza vaccination  Patient was given opportunity to  ask questions. Patient verbalized understanding of the plan and was able to repeat key elements of the plan. All questions were answered to their satisfaction.   I, Maximino Greenland, MD, have reviewed all documentation for this visit. The documentation on 01/28/22 for the exam, diagnosis, procedures, and orders are all accurate and complete.   THE PATIENT IS ENCOURAGED TO PRACTICE SOCIAL DISTANCING DUE TO THE COVID-19 PANDEMIC.

## 2022-01-28 NOTE — Patient Instructions (Signed)

## 2022-01-29 LAB — CMP14+EGFR
ALT: 12 IU/L (ref 0–32)
AST: 15 IU/L (ref 0–40)
Albumin/Globulin Ratio: 2.3 — ABNORMAL HIGH (ref 1.2–2.2)
Albumin: 5 g/dL — ABNORMAL HIGH (ref 3.8–4.9)
Alkaline Phosphatase: 61 IU/L (ref 44–121)
BUN/Creatinine Ratio: 14 (ref 9–23)
BUN: 11 mg/dL (ref 6–24)
Bilirubin Total: 0.3 mg/dL (ref 0.0–1.2)
CO2: 25 mmol/L (ref 20–29)
Calcium: 9.8 mg/dL (ref 8.7–10.2)
Chloride: 102 mmol/L (ref 96–106)
Creatinine, Ser: 0.78 mg/dL (ref 0.57–1.00)
Globulin, Total: 2.2 g/dL (ref 1.5–4.5)
Glucose: 87 mg/dL (ref 70–99)
Potassium: 4.5 mmol/L (ref 3.5–5.2)
Sodium: 140 mmol/L (ref 134–144)
Total Protein: 7.2 g/dL (ref 6.0–8.5)
eGFR: 91 mL/min/{1.73_m2} (ref >59)

## 2022-01-29 LAB — CBC
Hematocrit: 38.2 % (ref 34.0–46.6)
Hemoglobin: 12.6 g/dL (ref 11.1–15.9)
MCH: 29.4 pg (ref 26.6–33.0)
MCHC: 33 g/dL (ref 31.5–35.7)
MCV: 89 fL (ref 79–97)
Platelets: 396 10*3/uL (ref 150–450)
RBC: 4.28 x10E6/uL (ref 3.77–5.28)
RDW: 12.2 % (ref 11.7–15.4)
WBC: 7.6 10*3/uL (ref 3.4–10.8)

## 2022-01-29 LAB — LIPID PANEL
Chol/HDL Ratio: 4 ratio (ref 0.0–4.4)
Cholesterol, Total: 236 mg/dL — ABNORMAL HIGH (ref 100–199)
HDL: 59 mg/dL (ref >39)
LDL Chol Calc (NIH): 147 mg/dL — ABNORMAL HIGH (ref 0–99)
Triglycerides: 169 mg/dL — ABNORMAL HIGH (ref 0–149)
VLDL Cholesterol Cal: 30 mg/dL (ref 5–40)

## 2022-01-29 LAB — TSH+FREE T4
Free T4: 1.34 ng/dL (ref 0.82–1.77)
TSH: 4.09 u[IU]/mL (ref 0.450–4.500)

## 2022-01-29 LAB — HEMOGLOBIN A1C
Est. average glucose Bld gHb Est-mCnc: 108 mg/dL
Hgb A1c MFr Bld: 5.4 % (ref 4.8–5.6)

## 2022-01-29 LAB — VITAMIN B12: Vitamin B-12: 1113 pg/mL (ref 232–1245)

## 2022-01-29 LAB — INSULIN, RANDOM: INSULIN: 8.6 u[IU]/mL (ref 2.6–24.9)

## 2022-02-02 ENCOUNTER — Encounter: Payer: Self-pay | Admitting: Internal Medicine

## 2022-02-02 ENCOUNTER — Other Ambulatory Visit (HOSPITAL_COMMUNITY): Payer: Self-pay | Admitting: Gastroenterology

## 2022-02-02 DIAGNOSIS — R1033 Periumbilical pain: Secondary | ICD-10-CM

## 2022-02-02 LAB — URINE CULTURE

## 2022-02-15 ENCOUNTER — Ambulatory Visit (HOSPITAL_COMMUNITY)
Admission: RE | Admit: 2022-02-15 | Discharge: 2022-02-15 | Disposition: A | Discharge status: Home / Self Care | Payer: 59 | Source: Ambulatory Visit | Attending: Gastroenterology | Admitting: Gastroenterology

## 2022-02-15 DIAGNOSIS — R1033 Periumbilical pain: Secondary | ICD-10-CM | POA: Diagnosis present

## 2022-02-15 MED ORDER — IOHEXOL 300 MG/ML  SOLN
100.0000 mL | Freq: Once | INTRAMUSCULAR | Status: AC | PRN
  Administered 2022-02-15: 100 mL via INTRAVENOUS

## 2022-03-14 ENCOUNTER — Other Ambulatory Visit: Payer: Self-pay | Admitting: Internal Medicine

## 2022-04-18 ENCOUNTER — Other Ambulatory Visit: Payer: Self-pay | Admitting: Internal Medicine

## 2022-04-25 ENCOUNTER — Other Ambulatory Visit: Payer: Self-pay | Admitting: Allergy and Immunology

## 2022-07-19 ENCOUNTER — Encounter: Payer: Self-pay | Admitting: Internal Medicine

## 2022-07-20 ENCOUNTER — Encounter: Payer: Self-pay | Admitting: Neurology

## 2022-07-20 ENCOUNTER — Ambulatory Visit (INDEPENDENT_AMBULATORY_CARE_PROVIDER_SITE_OTHER): Payer: 59 | Admitting: Neurology

## 2022-07-20 VITALS — BP 124/78 | HR 80 | Ht 67.5 in | Wt 168.8 lb

## 2022-07-20 DIAGNOSIS — M791 Myalgia, unspecified site: Secondary | ICD-10-CM

## 2022-07-20 DIAGNOSIS — M542 Cervicalgia: Secondary | ICD-10-CM

## 2022-07-20 DIAGNOSIS — R200 Anesthesia of skin: Secondary | ICD-10-CM

## 2022-07-20 DIAGNOSIS — M79601 Pain in right arm: Secondary | ICD-10-CM | POA: Diagnosis not present

## 2022-07-20 DIAGNOSIS — M79602 Pain in left arm: Secondary | ICD-10-CM

## 2022-07-20 DIAGNOSIS — M797 Fibromyalgia: Secondary | ICD-10-CM

## 2022-07-20 MED ORDER — CYCLOBENZAPRINE HCL 5 MG PO TABS: 5.0000 mg | ORAL_TABLET | Freq: Three times a day (TID) | ORAL | 1 refills | Status: DC | PRN

## 2022-07-20 MED ORDER — GABAPENTIN 300 MG PO CAPS: 300.0000 mg | ORAL_CAPSULE | Freq: Three times a day (TID) | ORAL | 11 refills | Status: DC
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
## 2022-08-20 ENCOUNTER — Other Ambulatory Visit: Payer: Self-pay | Admitting: Internal Medicine

## 2022-08-30 ENCOUNTER — Other Ambulatory Visit: Payer: Self-pay | Admitting: Internal Medicine

## 2022-10-14 ENCOUNTER — Other Ambulatory Visit: Payer: Self-pay | Admitting: Internal Medicine

## 2022-12-17 ENCOUNTER — Other Ambulatory Visit: Payer: Self-pay | Admitting: Internal Medicine

## 2022-12-21 LAB — HM MAMMOGRAPHY

## 2023-02-01 ENCOUNTER — Ambulatory Visit (INDEPENDENT_AMBULATORY_CARE_PROVIDER_SITE_OTHER): Payer: 59 | Admitting: Plastic Surgery

## 2023-02-01 ENCOUNTER — Encounter: Payer: Self-pay | Admitting: Plastic Surgery

## 2023-02-01 VITALS — BP 116/74 | HR 69

## 2023-02-01 DIAGNOSIS — D171 Benign lipomatous neoplasm of skin and subcutaneous tissue of trunk: Secondary | ICD-10-CM | POA: Diagnosis not present

## 2023-02-01 DIAGNOSIS — D179 Benign lipomatous neoplasm, unspecified: Secondary | ICD-10-CM | POA: Insufficient documentation

## 2023-02-01 NOTE — Progress Notes (Signed)
Patient ID: Kristy Walter, female    DOB: [birth year], 52 y.o.   MRN: [id number]   Chief Complaint  Patient presents with   Advice Only   Skin Problem    The patient is a 52 year old female here for evaluation of her back.  On the right posterior thorax the patient has a 3.5 x 3.5 centimeter mass.  It is soft and slightly mobile.  It feels and looks like a lipoma.  Patient states it has been there for several years.  She was referred by her dermatologist.  She is otherwise in very good health.  She is not a smoker.  Nothing seems to make it better but it does seem to be getting larger with time.  Past medical history and surgical history is listed below.    Review of Systems  Constitutional: Negative.   HENT: Negative.    Eyes: Negative.   Respiratory: Negative.  Negative for chest tightness and shortness of breath.   Cardiovascular: Negative.   Gastrointestinal: Negative.   Endocrine: Negative.   Genitourinary: Negative.   Musculoskeletal: Negative.     Past Medical History:  Diagnosis Date   Asthma    no inhaler   Basal cell carcinoma (BCC) of female breast    left breast   Chest pain of uncertain etiology 03/09/2021   GERD (gastroesophageal reflux disease)    Hyperlipidemia 03/09/2021   Hypothyroidism    Seasonal allergies    Thyroid disease     Past Surgical History:  Procedure Laterality Date   AUGMENTATION MAMMAPLASTY     BILATERAL SALPINGECTOMY Bilateral 06/15/2012   Procedure: BILATERAL SALPINGECTOMY;  Surgeon: Lenoard Aden, MD;  Location: [facility];  Service: Gynecology;  Laterality: Bilateral;  BILATERAL SALPINGECTOMY   BREAST ENHANCEMENT SURGERY     CHOLECYSTECTOMY     CLAVICLE SURGERY     MASS EXCISION Left 11/03/2017   Procedure: EXCISION OF LEFT BREAST SKIN CANCER;  Surgeon: Peggye Form, DO;  Location: [facility];  Service: Plastics;  Laterality: Left;   NASAL SINUS SURGERY     ROBOTIC ASSISTED TOTAL HYSTERECTOMY N/A  06/15/2012   Procedure: ROBOTIC ASSISTED TOTAL HYSTERECTOMY;  Surgeon: Lenoard Aden, MD;  Location: [facility];  Service: Gynecology;  Laterality: N/A;  ROBOTIC ASSISTED TOTAL HYSTERECTOMY   Small tummy tuck     July 22nd 2019 at [facility]   TONSILLECTOMY        Current Outpatient Medications:    cetirizine (ZYRTEC) 10 MG tablet, Take 1 tablet (10 mg total) by mouth 2 (two) times daily as needed for allergies (Can take an extra dose during flare ups)., Disp: 30 tablet, Rfl: 5   cyclobenzaprine (FLEXERIL) 5 MG tablet, Take 1 tablet (5 mg total) by mouth 3 (three) times daily as needed for muscle spasms., Disp: 90 tablet, Rfl: 1   levothyroxine (SYNTHROID) 75 MCG tablet, 1 tablet in the morning on an empty stomach, Disp: , Rfl:    montelukast (SINGULAIR) 10 MG tablet, TAKE ONE TABLET ONCE DAILY AS DIRECTED., Disp: 90 tablet, Rfl: 1   pantoprazole (PROTONIX) 40 MG tablet, TAKE 1 TABLET BY MOUTH EVERY DAY, Disp: 90 tablet, Rfl: 0   tretinoin (RETIN-A) 0.05 % cream, Apply a small amount (pea size) twice a week on face and neck, Disp: , Rfl:    Vitamin D, Ergocalciferol, (DRISDOL) 1.25 MG (50000 UNIT) CAPS capsule, Take 50,000 Units by mouth once a week., Disp: , Rfl:    Olopatadine  HCl (PATADAY) 0.2 % SOLN, Place 1 drop into both eyes 1 day or 1 dose. (Patient not taking: Reported on 02/01/2023), Disp: 2.5 mL, Rfl: 5   OZEMPIC, 0.25 OR 0.5 MG/DOSE, 2 MG/3ML SOPN, Inject into the skin once a week. (Patient not taking: Reported on 02/01/2023), Disp: , Rfl:    Objective:   Vitals:   02/01/23 1414  BP: 116/74  Pulse: 69  SpO2: 99%    Physical Exam Vitals reviewed.  Constitutional:      Appearance: Normal appearance.  HENT:     Head: Atraumatic.  Cardiovascular:     Rate and Rhythm: Normal rate.     Pulses: Normal pulses.  Pulmonary:     Effort: Pulmonary effort is normal.  Musculoskeletal:        General: Swelling present. No tenderness or deformity.       Arms:  Skin:     General: Skin is warm.     Capillary Refill: Capillary refill takes less than 2 seconds.     Coloration: Skin is not jaundiced.     Findings: Lesion present. No bruising.  Neurological:     Mental Status: She is alert and oriented to person, place, and time.  Psychiatric:        Mood and Affect: Mood normal.        Behavior: Behavior normal.        Thought Content: Thought content normal.        Judgment: Judgment normal.     Assessment & Plan:  Lipoma of torso  Plan for excision of right back lipoma.  We will try to do this in the office.  I think she will do fine with that.  Scar and postop healing were described.  Pictures were obtained of the patient and placed in the chart with the patient's or guardian's permission.   Alena Bills Sharena Dibenedetto, DO

## 2023-02-07 ENCOUNTER — Ambulatory Visit (INDEPENDENT_AMBULATORY_CARE_PROVIDER_SITE_OTHER): Payer: 59 | Admitting: Internal Medicine

## 2023-02-07 ENCOUNTER — Encounter: Payer: Self-pay | Admitting: Internal Medicine

## 2023-02-07 VITALS — BP 108/78 | HR 75 | Temp 98.4°F | Ht 67.0 in | Wt 164.4 lb

## 2023-02-07 DIAGNOSIS — D171 Benign lipomatous neoplasm of skin and subcutaneous tissue of trunk: Secondary | ICD-10-CM | POA: Diagnosis not present

## 2023-02-07 DIAGNOSIS — E039 Hypothyroidism, unspecified: Secondary | ICD-10-CM | POA: Diagnosis not present

## 2023-02-07 DIAGNOSIS — Z2821 Immunization not carried out because of patient refusal: Secondary | ICD-10-CM | POA: Diagnosis not present

## 2023-02-07 DIAGNOSIS — Z0000 Encounter for general adult medical examination without abnormal findings: Secondary | ICD-10-CM | POA: Insufficient documentation

## 2023-02-07 DIAGNOSIS — R7309 Other abnormal glucose: Secondary | ICD-10-CM | POA: Diagnosis not present

## 2023-02-07 NOTE — Progress Notes (Signed)
I,[name], CMA,acting as a Neurosurgeon for Kristy Aliment, MD.,have documented all relevant documentation on the behalf of Kristy Aliment, MD,as directed by  Kristy Aliment, MD while in the presence of Kristy Aliment, MD.  Subjective:    Patient ID: Kristy Walter , female    DOB: [birth year] , 52 y.o.   MRN: [id number]  Chief Complaint  Patient presents with   Annual Exam   Hypothyroidism    HPI  Patient presents today for annual exam. She is followed by [facility] for her pelvic exams. She reports compliance with medications. Denies headache, chest pain & sob.  She is now taking compounded Wegovy as per her endocrinologist.     Thyroid Problem Presents for follow-up visit. Patient reports no constipation, dry skin, fatigue, nail problem or palpitations.     Past Medical History:  Diagnosis Date   Asthma    no inhaler   Basal cell carcinoma (BCC) of female breast    left breast   Chest pain of uncertain etiology 03/09/2021   GERD (gastroesophageal reflux disease)    Hyperlipidemia 03/09/2021   Hypothyroidism    Seasonal allergies    Thyroid disease      Family History  Problem Relation Age of Onset   Cancer Mother 56       colorectal cancer   Diabetes Father    Bone cancer Father 93   Cervical cancer Sister        dx. 30s   Allergic rhinitis Brother    Heart attack Maternal Aunt    Skin cancer Maternal Aunt        unknown type   Heart attack Maternal Uncle    Lung cancer Maternal Uncle        smoked   Heart failure Maternal Grandmother    Lung cancer Maternal Grandmother        smoked   Heart attack Maternal Grandfather    Lung cancer Maternal Grandfather        smoked   Ovarian cancer Paternal Grandmother        dx. 42s   Colon cancer Paternal Grandmother        dx. 50s   Lung cancer Paternal Grandfather        smoked   Allergic rhinitis Son      Current Outpatient Medications:    cetirizine (ZYRTEC) 10 MG tablet, Take 1 tablet (10  mg total) by mouth 2 (two) times daily as needed for allergies (Can take an extra dose during flare ups)., Disp: 30 tablet, Rfl: 5   levothyroxine (SYNTHROID) 75 MCG tablet, 1 tablet in the morning on an empty stomach, Disp: , Rfl:    montelukast (SINGULAIR) 10 MG tablet, TAKE ONE TABLET ONCE DAILY AS DIRECTED., Disp: 90 tablet, Rfl: 1   pantoprazole (PROTONIX) 40 MG tablet, TAKE 1 TABLET BY MOUTH EVERY DAY, Disp: 90 tablet, Rfl: 0   tretinoin (RETIN-A) 0.05 % cream, Apply a small amount (pea size) twice a week on face and neck, Disp: , Rfl:    Vitamin D, Ergocalciferol, (DRISDOL) 1.25 MG (50000 UNIT) CAPS capsule, Take 50,000 Units by mouth once a week., Disp: , Rfl:    Allergies  Allergen Reactions   Keflex [Cephalexin] Anaphylaxis    She swells and get hives everywhere 04/17/21-She reports she took this medication recently without any issues. She would like chart updated that she is not allergic to Augmentin.   Soy Allergy Other (See Comments) and Anaphylaxis  Upset stomach GI upset/pain   Sulfa Antibiotics Anaphylaxis    Pt states she can not breath   Sulfamethoxazole Anaphylaxis   Trimethoprim Anaphylaxis   Latex Hives   Other Nausea And Vomiting    Bananas and Egg whites   Sulfamethoxazole-Trimethoprim Rash      The patient states she uses none for birth control. Patient's last menstrual period was 05/31/2012.. Negative for Dysmenorrhea. Negative for: breast discharge, breast lump(s), breast pain and breast self exam. Associated symptoms include abnormal vaginal bleeding. Pertinent negatives include abnormal bleeding (hematology), anxiety, decreased libido, depression, difficulty falling sleep, dyspareunia, history of infertility, nocturia, sexual dysfunction, sleep disturbances, urinary incontinence, urinary urgency, vaginal discharge and vaginal itching. Diet regular.The patient states her exercise level is  moderate.  . The patient's tobacco use is:  Social History   Tobacco  Use  Smoking Status Never  Smokeless Tobacco Never  . She has been exposed to passive smoke. The patient's alcohol use is:  Social History   Substance and Sexual Activity  Alcohol Use Yes   Comment: rarely    Review of Systems  Constitutional: Negative.  Negative for fatigue.  HENT: Negative.    Eyes: Negative.   Respiratory: Negative.    Cardiovascular: Negative.  Negative for palpitations.  Gastrointestinal: Negative.  Negative for constipation.  Endocrine: Negative.   Genitourinary: Negative.   Musculoskeletal: Negative.   Skin: Negative.        She reports having a lipoma on her right shoulder blade. She is scheduled to get this removed in January. She would like to know if there is another dermatologist that could get her in sooner if possible.    Allergic/Immunologic: Negative.   Neurological: Negative.   Hematological: Negative.   Psychiatric/Behavioral: Negative.       Today's Vitals   02/07/23 1408  BP: 108/78  Pulse: 75  Temp: 98.4 F (36.9 C)  SpO2: 98%  Weight: 164 lb 6.4 oz (74.6 kg)  Height: 5\' 7"  (1.702 m)   Body mass index is 25.75 kg/m.  Wt Readings from Last 3 Encounters:  02/07/23 164 lb 6.4 oz (74.6 kg)  07/20/22 168 lb 12.8 oz (76.6 kg)  01/28/22 162 lb 9.6 oz (73.8 kg)     Objective:  Physical Exam Vitals and nursing note reviewed.  Constitutional:      Appearance: Normal appearance.  HENT:     Head: Normocephalic and atraumatic.     Right Ear: Tympanic membrane, ear canal and external ear normal.     Left Ear: Tympanic membrane, ear canal and external ear normal.     Nose: Nose normal.     Mouth/Throat:     Mouth: Mucous membranes are moist.     Pharynx: Oropharynx is clear.  Eyes:     Extraocular Movements: Extraocular movements intact.     Conjunctiva/sclera: Conjunctivae normal.     Pupils: Pupils are equal, round, and reactive to light.  Cardiovascular:     Rate and Rhythm: Normal rate and regular rhythm.     Pulses: Normal  pulses.     Heart sounds: Normal heart sounds.  Pulmonary:     Effort: Pulmonary effort is normal.     Breath sounds: Normal breath sounds.  Chest:  Breasts:    Tanner Score is 5.     Right: Normal.     Left: Normal.     Comments: Healed surgical scars b/l breasts Abdominal:     General: Abdomen is flat. Bowel sounds are normal.  Palpations: Abdomen is soft.  Genitourinary:    Comments: deferred Musculoskeletal:        General: Normal range of motion.     Cervical back: Normal range of motion and neck supple.  Skin:    General: Skin is warm and dry.  Neurological:     General: No focal deficit present.     Mental Status: She is alert and oriented to person, place, and time.  Psychiatric:        Mood and Affect: Mood normal.        Behavior: Behavior normal.         Assessment And Plan:     Annual physical exam Assessment & Plan: A full exam was performed.  Importance of monthly self breast exams was discussed with the patient.  She is advised to get 30-45 minutes of regular exercise, no less than four to five days per week. Both weight-bearing and aerobic exercises are recommended.  She is advised to follow a healthy diet with at least six fruits/veggies per day, decrease intake of red meat and other saturated fats and to increase fish intake to twice weekly.  Meats/fish should not be fried -- baked, boiled or broiled is preferable. It is also important to cut back on your sugar intake.  Be sure to read labels - try to avoid anything with added sugar, high fructose corn syrup or other sweeteners.  If you must use a sweetener, you can try stevia or monkfruit.  It is also important to avoid artificially sweetened foods/beverages and diet drinks. Lastly, wear SPF 50 sunscreen on exposed skin and when in direct sunlight for an extended period of time.  Be sure to avoid fast food restaurants and aim for at least 60 ounces of water daily.      Orders: -     CBC -      CMP14+EGFR -     Lipid panel -     TSH + free T4 -     T3, free  Primary hypothyroidism Assessment & Plan: Chronic, now followed by Endocrinology. Will check thyroid panel and forward results to Dr. Talmage Nap for her review. Currently, she will continue with levothyroxineSynthroid daily.   Orders: -     TSH + free T4 -     T3, free  Lipoma of torso Assessment & Plan: She is encouraged to keep upcoming Surgery evaluation. [facility] notes reviewed in detail.  They may be able to see her prior to Jan 2025, when she is scheduled to see Plastics for excision of lipoma.     Other abnormal glucose Assessment & Plan: Previous labs reviewed, her A1c has been elevated in the past. I will check an A1c today. Reminded to avoid refined sugars including sugary drinks/foods and processed meats including bacon, sausages and deli meats.    Orders: -     Hemoglobin A1c -     Insulin, random  Immunization declined     Return for 1 year HM, 6 month thyroid f/u. Marland Kitchen Patient was given opportunity to ask questions. Patient verbalized understanding of the plan and was able to repeat key elements of the plan. All questions were answered to their satisfaction.     I, Kristy Aliment, MD, have reviewed all documentation for this visit. The documentation on 02/07/23 for the exam, diagnosis, procedures, and orders are all accurate and complete.

## 2023-02-07 NOTE — Assessment & Plan Note (Signed)
Previous labs reviewed, her A1c has been elevated in the past. I will check an A1c today. Reminded to avoid refined sugars including sugary drinks/foods and processed meats including bacon, sausages and deli meats.  

## 2023-02-07 NOTE — Assessment & Plan Note (Signed)

## 2023-02-07 NOTE — Assessment & Plan Note (Signed)
Chronic, now followed by Endocrinology. Will check thyroid panel and forward results to Dr. Talmage Nap for her review. Currently, she will continue with levothyroxineSynthroid daily.

## 2023-02-07 NOTE — Patient Instructions (Signed)

## 2023-02-07 NOTE — Assessment & Plan Note (Addendum)
She is encouraged to keep upcoming Surgery evaluation. [facility] Derm notes reviewed in detail.  They may be able to see her prior to Jan 2025, when she is scheduled to see Plastics for excision of lipoma.

## 2023-02-08 LAB — CMP14+EGFR
ALT: 12 [IU]/L (ref 0–32)
AST: 20 [IU]/L (ref 0–40)
Albumin: 4.8 g/dL (ref 3.8–4.9)
Alkaline Phosphatase: 63 [IU]/L (ref 44–121)
BUN/Creatinine Ratio: 14 (ref 9–23)
BUN: 10 mg/dL (ref 6–24)
Bilirubin Total: 0.4 mg/dL (ref 0.0–1.2)
CO2: 24 mmol/L (ref 20–29)
Calcium: 9.7 mg/dL (ref 8.7–10.2)
Chloride: 101 mmol/L (ref 96–106)
Creatinine, Ser: 0.74 mg/dL (ref 0.57–1.00)
Globulin, Total: 2.3 g/dL (ref 1.5–4.5)
Glucose: 67 mg/dL — ABNORMAL LOW (ref 70–99)
Potassium: 4.4 mmol/L (ref 3.5–5.2)
Sodium: 140 mmol/L (ref 134–144)
Total Protein: 7.1 g/dL (ref 6.0–8.5)
eGFR: 97 mL/min/{1.73_m2} (ref >59)

## 2023-02-08 LAB — TSH+FREE T4
Free T4: 1.4 ng/dL (ref 0.82–1.77)
TSH: 2.52 u[IU]/mL (ref 0.450–4.500)

## 2023-02-08 LAB — LIPID PANEL
Chol/HDL Ratio: 4.3 ratio (ref 0.0–4.4)
Cholesterol, Total: 209 mg/dL — ABNORMAL HIGH (ref 100–199)
HDL: 49 mg/dL (ref >39)
LDL Chol Calc (NIH): 131 mg/dL — ABNORMAL HIGH (ref 0–99)
Triglycerides: 161 mg/dL — ABNORMAL HIGH (ref 0–149)
VLDL Cholesterol Cal: 29 mg/dL (ref 5–40)

## 2023-02-08 LAB — HEMOGLOBIN A1C
Est. average glucose Bld gHb Est-mCnc: 111 mg/dL
Hgb A1c MFr Bld: 5.5 % (ref 4.8–5.6)

## 2023-02-08 LAB — CBC
Hematocrit: 40.9 % (ref 34.0–46.6)
Hemoglobin: 13.3 g/dL (ref 11.1–15.9)
MCH: 29.6 pg (ref 26.6–33.0)
MCHC: 32.5 g/dL (ref 31.5–35.7)
MCV: 91 fL (ref 79–97)
Platelets: 415 10*3/uL (ref 150–450)
RBC: 4.5 x10E6/uL (ref 3.77–5.28)
RDW: 12.1 % (ref 11.7–15.4)
WBC: 5.9 10*3/uL (ref 3.4–10.8)

## 2023-02-08 LAB — T3, FREE: T3, Free: 2.6 pg/mL (ref 2.0–4.4)

## 2023-02-08 LAB — INSULIN, RANDOM: INSULIN: 9.8 u[IU]/mL (ref 2.6–24.9)

## 2023-03-03 ENCOUNTER — Other Ambulatory Visit: Payer: Self-pay | Admitting: Plastic Surgery

## 2023-03-03 DIAGNOSIS — D171 Benign lipomatous neoplasm of skin and subcutaneous tissue of trunk: Secondary | ICD-10-CM

## 2023-03-03 NOTE — Progress Notes (Signed)
Request for surgery

## 2023-03-13 ENCOUNTER — Other Ambulatory Visit: Payer: Self-pay | Admitting: Internal Medicine

## 2023-03-21 ENCOUNTER — Telehealth: Payer: Self-pay | Admitting: Plastic Surgery

## 2023-03-21 NOTE — Telephone Encounter (Signed)
Patient wants to know if she is ok to go back to work after lipoma removal.

## 2023-03-24 ENCOUNTER — Other Ambulatory Visit (HOSPITAL_COMMUNITY)
Admission: RE | Admit: 2023-03-24 | Discharge: 2023-03-24 | Disposition: A | Discharge status: Home / Self Care | Payer: 59 | Source: Ambulatory Visit | Attending: Plastic Surgery | Admitting: Plastic Surgery

## 2023-03-24 ENCOUNTER — Ambulatory Visit (INDEPENDENT_AMBULATORY_CARE_PROVIDER_SITE_OTHER): Payer: 59 | Admitting: Plastic Surgery

## 2023-03-24 VITALS — BP 111/79 | HR 82 | Ht 67.0 in | Wt 163.4 lb

## 2023-03-24 DIAGNOSIS — D171 Benign lipomatous neoplasm of skin and subcutaneous tissue of trunk: Secondary | ICD-10-CM | POA: Diagnosis present

## 2023-03-24 NOTE — Progress Notes (Signed)
Procedure Note  Preoperative Dx: mass of back  Postoperative Dx: Same  Procedure: mass/lipoma of right back 5 cm  Anesthesia: Lidocaine 1% with 1:100,000 epinephrine   Description of Procedure: Risks and complications were explained to the patient.  Consent was confirmed and the patient understands the risks and benefits.  The potential complications and alternatives were explained and the patient consents.  The patient expressed understanding the option of not having the procedure and the risks of a scar.  Time out was called and all information was confirmed to be correct.    The area was prepped and drapped.  Lidocaine 1% with epinephrine was injected in the subcutaneous area.  After waiting several minutes for the local to take affect a #15 blade was used to incise the skin over the mass.  The tissue scissors were used to release the mass which was a 5 cm looking lipoma.   A 4-0 Monocryl was used to close the deep layers with simple interrupted stitches.  The skin edges were reapproximated with 4-0 Monocryl subcuticular running closure.  A dressing was applied.  The patient was given instructions on how to care for the area and a follow up appointment.  Kristy Walter tolerated the procedure well and there were no complications. The specimen was sent to pathology.

## 2023-03-28 LAB — SURGICAL PATHOLOGY

## 2023-04-01 ENCOUNTER — Ambulatory Visit (INDEPENDENT_AMBULATORY_CARE_PROVIDER_SITE_OTHER): Payer: 59 | Admitting: Plastic Surgery

## 2023-04-01 DIAGNOSIS — D171 Benign lipomatous neoplasm of skin and subcutaneous tissue of trunk: Secondary | ICD-10-CM

## 2023-04-01 NOTE — Progress Notes (Signed)
 The patient is a 53 year old female here for follow-up after excision of a lipoma on her back.  Pathology was negative.  The area is healing extremely well.  There is no sign of seroma or bruising.  I remove the stitches and put another Steri-Strip on.  Follow-up as needed.

## 2023-04-07 ENCOUNTER — Other Ambulatory Visit: Payer: Self-pay | Admitting: Internal Medicine

## 2023-04-07 ENCOUNTER — Encounter: Payer: Self-pay | Admitting: Internal Medicine

## 2023-04-07 DIAGNOSIS — E7800 Pure hypercholesterolemia, unspecified: Secondary | ICD-10-CM

## 2023-04-19 ENCOUNTER — Ambulatory Visit: Payer: 59 | Admitting: Plastic Surgery

## 2023-05-06 ENCOUNTER — Ambulatory Visit (HOSPITAL_COMMUNITY)
Admission: RE | Admit: 2023-05-06 | Discharge: 2023-05-06 | Disposition: A | Discharge status: Home / Self Care | Payer: Self-pay | Source: Ambulatory Visit | Attending: Internal Medicine | Admitting: Internal Medicine

## 2023-05-06 DIAGNOSIS — E7800 Pure hypercholesterolemia, unspecified: Secondary | ICD-10-CM | POA: Insufficient documentation

## 2023-05-16 ENCOUNTER — Other Ambulatory Visit: Payer: 59

## 2023-05-16 DIAGNOSIS — E7800 Pure hypercholesterolemia, unspecified: Secondary | ICD-10-CM

## 2023-05-19 LAB — LIPOPROTEIN A (LPA): Lipoprotein (a): <8.4 nmol/L (ref <75.0)

## 2023-05-27 ENCOUNTER — Ambulatory Visit: Payer: 59 | Admitting: Plastic Surgery

## 2023-06-07 ENCOUNTER — Other Ambulatory Visit (HOSPITAL_COMMUNITY): Payer: Self-pay | Admitting: Gastroenterology

## 2023-06-07 DIAGNOSIS — R1033 Periumbilical pain: Secondary | ICD-10-CM

## 2023-06-20 ENCOUNTER — Ambulatory Visit (HOSPITAL_COMMUNITY)
Admission: RE | Admit: 2023-06-20 | Discharge: 2023-06-20 | Disposition: A | Discharge status: Home / Self Care | Source: Ambulatory Visit | Attending: Gastroenterology | Admitting: Gastroenterology

## 2023-06-20 DIAGNOSIS — R1033 Periumbilical pain: Secondary | ICD-10-CM | POA: Diagnosis present

## 2023-06-20 MED ORDER — IOHEXOL 300 MG/ML  SOLN
100.0000 mL | Freq: Once | INTRAMUSCULAR | Status: AC | PRN
  Administered 2023-06-20: 100 mL via INTRAVENOUS

## 2023-07-16 ENCOUNTER — Other Ambulatory Visit: Payer: Self-pay | Admitting: Internal Medicine

## 2023-07-18 ENCOUNTER — Encounter: Payer: Self-pay | Admitting: Internal Medicine

## 2023-07-18 ENCOUNTER — Other Ambulatory Visit: Payer: Self-pay | Admitting: Internal Medicine

## 2023-07-18 ENCOUNTER — Ambulatory Visit: Payer: 59 | Admitting: Neurology

## 2023-07-18 ENCOUNTER — Ambulatory Visit: Payer: 59 | Admitting: Internal Medicine

## 2023-07-18 MED ORDER — AZITHROMYCIN 250 MG PO TABS: ORAL_TABLET | ORAL | 0 refills | Status: AC

## 2023-07-18 MED ORDER — FLUCONAZOLE 150 MG PO TABS: ORAL_TABLET | ORAL | 0 refills | Status: DC

## 2023-08-08 ENCOUNTER — Other Ambulatory Visit: Payer: Self-pay | Admitting: Internal Medicine

## 2023-08-08 ENCOUNTER — Other Ambulatory Visit: Payer: Self-pay | Admitting: Neurology

## 2023-08-08 ENCOUNTER — Ambulatory Visit: Payer: 59 | Admitting: Internal Medicine

## 2023-08-09 NOTE — Telephone Encounter (Signed)
 Last seen on 07/20/22 Follow up scheduled 02/27/24   .

## 2023-11-29 ENCOUNTER — Encounter: Payer: Self-pay | Admitting: Internal Medicine

## 2023-12-05 ENCOUNTER — Encounter: Payer: Self-pay | Admitting: Internal Medicine

## 2023-12-07 ENCOUNTER — Encounter: Payer: Self-pay | Admitting: Internal Medicine

## 2024-01-02 ENCOUNTER — Encounter: Payer: Self-pay | Admitting: Internal Medicine

## 2024-01-02 ENCOUNTER — Ambulatory Visit: Payer: Self-pay

## 2024-01-02 NOTE — Telephone Encounter (Signed)
 Patient did not wish to be seen in another office. Stated she will go to [facility].   FYI Only or Action Required?: FYI only for provider.  Patient was last seen in primary care on 02/07/2023 by Jarold Medici, MD.  Called Nurse Triage reporting Urinary Tract Infection.  Symptoms began a week ago.  Interventions attempted: Rest, hydration, or home remedies.  Symptoms are: gradually worsening.  Triage Disposition: See HCP Within 4 Hours (Or PCP Triage)  Patient/caregiver understands and will follow disposition?: Yes  Reason for Disposition  Side (flank) or lower back pain present  Answer Assessment - Initial Assessment Questions Dysuria with lower back pain and abdominal pressure.  1. SYMPTOM: What's the main symptom you're concerned about? (e.g., frequency, incontinence)     Urgency, frequency, burning  2. ONSET:      One week  3. PAIN: Is there any pain? If Yes, ask: How bad is it? (Scale: 1-10; mild, moderate, severe)     Burning sensation  4. CAUSE: What do you think is causing the symptoms?     UTI  5. OTHER SYMPTOMS: Do you have any other symptoms? (e.g., blood in urine, fever, flank pain, pain with urination)     Flank pain, unsure about fever  Protocols used: Urinary Symptoms-A-AH Copied from CRM #[id number]. Topic: Clinical - Red Word Triage >> Jan 02, 2024 12:00 PM Selinda RAMAN wrote: Red Word that prompted transfer to Nurse Triage: The patient called in stating she has had urinary urgency, frequency and burning for almost a week now. She says at first she did not think that is what  was but now she feels pretty sure and it is not getting any better. I will transfer her to [facility]

## 2024-01-03 ENCOUNTER — Other Ambulatory Visit: Payer: Self-pay | Admitting: Internal Medicine

## 2024-01-03 MED ORDER — FLUCONAZOLE 150 MG PO TABS: ORAL_TABLET | ORAL | 0 refills | Status: AC

## 2024-01-03 MED ORDER — AZITHROMYCIN 500 MG PO TABS: ORAL_TABLET | ORAL | 0 refills | Status: AC

## 2024-01-12 ENCOUNTER — Ambulatory Visit: Payer: Self-pay

## 2024-01-12 NOTE — Telephone Encounter (Signed)
 FYI Only or Action Required?: FYI only for provider.  Patient was last seen in primary care on 02/07/2023 by Jarold Medici, MD.  Called Nurse Triage reporting Urinary Frequency.  Symptoms began several weeks ago.  Interventions attempted: Prescription medications: macrobid .  Symptoms are: gradually worsening.  Triage Disposition: See HCP Within 4 Hours (Or PCP Triage)  Patient/caregiver understands and will follow disposition?: Yes  Copied from CRM [id number]. Topic: Clinical - Red Word Triage >> Jan 12, 2024  9:02 AM Antwanette L wrote: Red Word that prompted transfer to Nurse Triage: Possible uti. Pt is reporting frequent urination, burning, and abdominal pain. Pt has had the symptoms for a week n half Reason for Disposition  Side (flank) or lower back pain present  Answer Assessment - Initial Assessment Questions Pt states 1.5 wks ago she felt she was getting a UTI and used AZO. Attempted to make app, did video visit, took Macrobid . States she now has lower abdominal pain near her bladder, and back pain. She is having hematuria as well. Pt refuses to be seen at another clinic, but earliest availability at PCP is Oct 29th. Based on triage sxs and wait time, RN recommend UC at this time. Pt agreeable   1. SEVERITY: How bad is the pain?  (e.g., Scale 1-10; mild, moderate, or severe)     7/10 2. FREQUENCY: How many times have you had painful urination today?      Every time 3. PATTERN: Is pain present every time you urinate or just sometimes?      Every time 4. ONSET: When did the painful urination start?      1.5-2 wks ago 5. FEVER: Do you have a fever? If Yes, ask: What is your temperature, how was it measured, and when did it start?     unsure 6. PAST UTI: Have you had a urine infection before? If Yes, ask: When was the last time? and What happened that time?      yes 7. CAUSE: What do you think is causing the painful urination?  (e.g., UTI, scratch, Herpes  sore)     UTI 8. OTHER SYMPTOMS: Do you have any other symptoms? (e.g., blood in urine, flank pain, genital sores, urgency, vaginal discharge)     Hematuria, abd pain lower near bladder  Protocols used: Urination Pain - Female-A-AH

## 2024-01-21 ENCOUNTER — Other Ambulatory Visit: Payer: Self-pay | Admitting: Internal Medicine

## 2024-02-06 ENCOUNTER — Ambulatory Visit (INDEPENDENT_AMBULATORY_CARE_PROVIDER_SITE_OTHER): Payer: Self-pay | Admitting: Internal Medicine

## 2024-02-06 ENCOUNTER — Encounter: Payer: Self-pay | Admitting: Internal Medicine

## 2024-02-06 VITALS — BP 108/80 | HR 77 | Temp 98.3°F | Ht 67.0 in | Wt 171.4 lb

## 2024-02-06 DIAGNOSIS — E039 Hypothyroidism, unspecified: Secondary | ICD-10-CM

## 2024-02-06 DIAGNOSIS — R7309 Other abnormal glucose: Secondary | ICD-10-CM | POA: Diagnosis not present

## 2024-02-06 DIAGNOSIS — N390 Urinary tract infection, site not specified: Secondary | ICD-10-CM

## 2024-02-06 DIAGNOSIS — Z0000 Encounter for general adult medical examination without abnormal findings: Secondary | ICD-10-CM

## 2024-02-06 LAB — POCT URINALYSIS DIPSTICK
Bilirubin, UA: NEGATIVE
Blood, UA: NEGATIVE
Glucose, UA: NEGATIVE
Ketones, UA: NEGATIVE
Nitrite, UA: NEGATIVE
Protein, UA: NEGATIVE
Spec Grav, UA: 1.025 (ref 1.010–1.025)
Urobilinogen, UA: 0.2 U/dL
pH, UA: 6 (ref 5.0–8.0)

## 2024-02-06 LAB — HM MAMMOGRAPHY

## 2024-02-06 NOTE — Assessment & Plan Note (Signed)
 Previous labs reviewed, her A1c has been elevated in the past. I will check an A1c today. Reminded to avoid refined sugars including sugary drinks/foods and processed meats including bacon, sausages and deli meats.

## 2024-02-06 NOTE — Assessment & Plan Note (Signed)

## 2024-02-06 NOTE — Patient Instructions (Signed)

## 2024-02-06 NOTE — Progress Notes (Unsigned)
 I,Victoria T Emmitt, CMA,acting as a neurosurgeon for Catheryn LOISE Slocumb, MD.,have documented all relevant documentation on the behalf of Catheryn LOISE Slocumb, MD,as directed by  Catheryn LOISE Slocumb, MD while in the presence of Catheryn LOISE Slocumb, MD.  Subjective:    Patient ID: Kristy Walter , female    DOB: [birth year] , 53 y.o.   MRN: [id number]  Chief Complaint  Patient presents with  . Annual Exam    Patient presents today for annual exam. She is followed by [facility] for her pelvic exams. She reports compliance with medications. Denies headache, chest pain & sob.   SABRA Hypothyroidism  . Hyperlipidemia    HPI Discussed the use of AI scribe software for clinical note transcription with the patient, who gave verbal consent to proceed.  History of Present Illness Kristy Walter is a 53 year old female with a history of recurrent urinary tract infections who presents with ongoing symptoms of a urinary tract infection.  She has ongoing symptoms of a urinary tract infection despite recent treatment. A three-day course of Cipro  starting Thursday improved her symptoms significantly, but she continues to experience some symptoms. Initially, she had abdominal cramping, dysuria, and urgency. She has a history of frequent UTIs and has not had a urology evaluation or discussed this with her gynecologist. She recalls a previous episode a couple of months ago, which she managed with Azo and increased water intake, but symptoms recurred. She urinates after sexual intercourse to prevent infections.  She has been off Zepbound for approximately twelve weeks due to the high cost, which was $750 per month. Previously, she was on Wegovy , which was also expensive. Her weight has increased by eight pounds since last December, and she aims to maintain a weight of 155-160 pounds. She engages in physical activity, including cardio and strength training, four times a week, but avoids overhead exercises due to neck pain.  Her  father, who is 30 years old, recently underwent a kidney biopsy due to a suspicious spot. He is diabetic with an A1c of 6.1 but does not adhere strictly to dietary recommendations.  She recently had a mammogram and is planning a colonoscopy due to a previous finding of wall thickening in the ascending colon and a history of multiple polyps. She experiences chronic lower abdominal pain, which she attributes to stress. She has a history of gastrointestinal issues and describes the pain as 'terrible'.  She has a history of prediabetes, which was controlled while on Wegovy .   Thyroid  Problem Presents for follow-up visit. Patient reports no constipation, dry skin, fatigue, nail problem or palpitations.     Past Medical History:  Diagnosis Date  . Asthma    no inhaler  . Basal cell carcinoma (BCC) of female breast    left breast  . Chest pain of uncertain etiology 03/09/2021  . GERD (gastroesophageal reflux disease)   . Hyperlipidemia 03/09/2021  . Hypothyroidism   . Seasonal allergies   . Thyroid  disease      Family History  Problem Relation Age of Onset  . Cancer Mother 26       colorectal cancer  . Diabetes Father   . Bone cancer Father 29  . Cervical cancer Sister        dx. 30s  . Allergic rhinitis Brother   . Heart attack Maternal Aunt   . Skin cancer Maternal Aunt        unknown type  . Heart attack Maternal Uncle   .  Lung cancer Maternal Uncle        smoked  . Heart failure Maternal Grandmother   . Lung cancer Maternal Grandmother        smoked  . Heart attack Maternal Grandfather   . Lung cancer Maternal Grandfather        smoked  . Ovarian cancer Paternal Grandmother        dx. 73s  . Colon cancer Paternal Grandmother        dx. 8s  . Lung cancer Paternal Grandfather        smoked  . Allergic rhinitis Son      Current Outpatient Medications:  .  cetirizine  (ZYRTEC ) 10 MG tablet, Take 1 tablet (10 mg total) by mouth 2 (two) times daily as needed for  allergies (Can take an extra dose during flare ups)., Disp: 30 tablet, Rfl: 5 .  cyclobenzaprine  (FLEXERIL ) 5 MG tablet, TAKE 1 TABLET BY MOUTH THREE TIMES A DAY AS NEEDED FOR MUSCLE SPASMS, Disp: 90 tablet, Rfl: 1 .  levothyroxine  (SYNTHROID ) 75 MCG tablet, 1 tablet in the morning on an empty stomach, Disp: , Rfl:  .  montelukast  (SINGULAIR ) 10 MG tablet, TAKE ONE TABLET ONCE DAILY AS DIRECTED., Disp: 90 tablet, Rfl: 1 .  pantoprazole  (PROTONIX ) 40 MG tablet, TAKE 1 TABLET BY MOUTH EVERY DAY, Disp: 90 tablet, Rfl: 0 .  tretinoin (RETIN-A) 0.05 % cream, Apply a small amount (pea size) twice a week on face and neck, Disp: , Rfl:  .  azithromycin  (ZITHROMAX ) 500 MG tablet, Take 1 tablet daily for 3 days as needed for traveler's diarrhea (Patient not taking: Reported on 02/06/2024), Disp: 3 tablet, Rfl: 0 .  fluconazole  (DIFLUCAN ) 150 MG tablet, Take one tab po today, repeat in 48 hours if needed (Patient not taking: Reported on 02/06/2024), Disp: 2 tablet, Rfl: 0 .  Vitamin D, Ergocalciferol, (DRISDOL) 1.25 MG (50000 UNIT) CAPS capsule, Take 50,000 Units by mouth once a week. (Patient not taking: Reported on 02/06/2024), Disp: , Rfl:    Allergies  Allergen Reactions  . Keflex [Cephalexin] Anaphylaxis    She swells and get hives everywhere 04/17/21-She reports she took this medication recently without any issues. She would like chart updated that she is not allergic to Augmentin .  . Soy Allergy (Obsolete) Other (See Comments) and Anaphylaxis    Upset stomach GI upset/pain  . Sulfa Antibiotics Anaphylaxis    Pt states she can not breath  . Sulfamethoxazole Anaphylaxis  . Trimethoprim Anaphylaxis  . Latex Hives  . Other Nausea And Vomiting    Bananas and Egg whites  . Sulfamethoxazole-Trimethoprim Rash      The patient states she uses none for birth control. Patient's last menstrual period was 05/31/2012.. Negative for Dysmenorrhea. Negative for: breast discharge, breast lump(s), breast pain  and breast self exam. Associated symptoms include abnormal vaginal bleeding. Pertinent negatives include abnormal bleeding (hematology), anxiety, decreased libido, depression, difficulty falling sleep, dyspareunia, history of infertility, nocturia, sexual dysfunction, sleep disturbances, urinary incontinence, urinary urgency, vaginal discharge and vaginal itching. Diet regular.The patient states her exercise level is  moderate.  . The patient's tobacco use is:  Social History   Tobacco Use  Smoking Status Never  Smokeless Tobacco Never  . She has been exposed to passive smoke. The patient's alcohol use is:  Social History   Substance and Sexual Activity  Alcohol Use Yes   Comment: rarely    Review of Systems  Constitutional: Negative.  Negative for fatigue.  HENT: Negative.  Eyes: Negative.   Respiratory: Negative.    Cardiovascular: Negative.  Negative for palpitations.  Gastrointestinal: Negative.  Negative for constipation.  Endocrine: Negative.   Genitourinary: Negative.   Musculoskeletal: Negative.   Skin: Negative.   Allergic/Immunologic: Negative.   Neurological: Negative.   Hematological: Negative.   Psychiatric/Behavioral: Negative.       Today's Vitals   02/06/24 1540  BP: 108/80  Pulse: 77  Temp: 98.3 F (36.8 C)  SpO2: 98%  Weight: 171 lb 6.4 oz (77.7 kg)  Height: 5' 7 (1.702 m)   Body mass index is 26.85 kg/m.  Wt Readings from Last 3 Encounters:  02/06/24 171 lb 6.4 oz (77.7 kg)  03/24/23 163 lb 6.4 oz (74.1 kg)  02/07/23 164 lb 6.4 oz (74.6 kg)     Objective:  Physical Exam Vitals and nursing note reviewed.  Constitutional:      Appearance: Normal appearance.  HENT:     Head: Normocephalic and atraumatic.     Right Ear: Tympanic membrane, ear canal and external ear normal.     Left Ear: Tympanic membrane, ear canal and external ear normal.     Nose: Nose normal.     Mouth/Throat:     Mouth: Mucous membranes are moist.     Pharynx:  Oropharynx is clear.  Eyes:     Extraocular Movements: Extraocular movements intact.     Conjunctiva/sclera: Conjunctivae normal.     Pupils: Pupils are equal, round, and reactive to light.  Cardiovascular:     Rate and Rhythm: Normal rate and regular rhythm.     Pulses: Normal pulses.     Heart sounds: Normal heart sounds.  Pulmonary:     Effort: Pulmonary effort is normal.     Breath sounds: Normal breath sounds.  Chest:  Breasts:    Tanner Score is 5.     Right: Normal.     Left: Normal.     Comments: Healed surgical scars b/l breasts Abdominal:     General: Abdomen is flat. Bowel sounds are normal.     Palpations: Abdomen is soft.  Genitourinary:    Comments: deferred Musculoskeletal:        General: Normal range of motion.     Cervical back: Normal range of motion and neck supple.  Skin:    General: Skin is warm and dry.  Neurological:     General: No focal deficit present.     Mental Status: She is alert and oriented to person, place, and time.  Psychiatric:        Mood and Affect: Mood normal.        Behavior: Behavior normal.         Assessment And Plan:     Annual physical exam Assessment & Plan: A full exam was performed.  Importance of monthly self breast exams was discussed with the patient.  She is advised to get 30-45 minutes of regular exercise, no less than four to five days per week. Both weight-bearing and aerobic exercises are recommended.  She is advised to follow a healthy diet with at least six fruits/veggies per day, decrease intake of red meat and other saturated fats and to increase fish intake to twice weekly.  Meats/fish should not be fried -- baked, boiled or broiled is preferable. It is also important to cut back on your sugar intake.  Be sure to read labels - try to avoid anything with added sugar, high fructose corn syrup or other sweeteners.  If you  must use a sweetener, you can try stevia or monkfruit.  It is also important to avoid  artificially sweetened foods/beverages and diet drinks. Lastly, wear SPF 50 sunscreen on exposed skin and when in direct sunlight for an extended period of time.  Be sure to avoid fast food restaurants and aim for at least 60 ounces of water daily.       Other abnormal glucose Assessment & Plan: Previous labs reviewed, her A1c has been elevated in the past. I will check an A1c today. Reminded to avoid refined sugars including sugary drinks/foods and processed meats including bacon, sausages and deli meats.     Primary hypothyroidism  Pure hypercholesterolemia  Need for shingles vaccine   Assessment & Plan Adult Wellness Visit Routine wellness visit with health maintenance discussion. She declined shingles vaccination due to side effect concerns, plans to receive it in January. Thyroid  management deferred to Dr. Ballen. - Ordered blood count, liver and kidney function tests, cholesterol, and A1c. - Deferred thyroid  management to Dr. Ballen. - Plan for shingles vaccination in January.  Recurrent urinary tract infection Recurrent UTIs with symptoms of abdominal cramping, pain, and urgency. Differential includes anatomical issues or atrophic vaginitis. Referral to urology considered. - Ordered urine culture. - Referred to urology for further evaluation.  Abnormal glucose (prediabetes) Wegovy  discontinued due to cost, resulting in 8-pound weight gain. Ideal weight is 155-160 pounds. - Discuss potential future use of Wegovy  with cost considerations. Return for 1 YEAR HM, 6 MONTH CHOL F/U.SABRA Patient was given opportunity to ask questions. Patient verbalized understanding of the plan and was able to repeat key elements of the plan. All questions were answered to their satisfaction.   I, Catheryn LOISE Slocumb, MD, have reviewed all documentation for this visit. The documentation on 02/06/24 for the exam, diagnosis, procedures, and orders are all accurate and complete.

## 2024-02-07 ENCOUNTER — Ambulatory Visit: Payer: Self-pay | Admitting: Internal Medicine

## 2024-02-07 LAB — CMP14+EGFR
ALT: 12 IU/L (ref 0–32)
AST: 17 IU/L (ref 0–40)
Albumin: 4.7 g/dL (ref 3.8–4.9)
Alkaline Phosphatase: 69 IU/L (ref 49–135)
BUN/Creatinine Ratio: 12 (ref 9–23)
BUN: 10 mg/dL (ref 6–24)
Bilirubin Total: 0.3 mg/dL (ref 0.0–1.2)
CO2: 23 mmol/L (ref 20–29)
Calcium: 9.7 mg/dL (ref 8.7–10.2)
Chloride: 102 mmol/L (ref 96–106)
Creatinine, Ser: 0.83 mg/dL (ref 0.57–1.00)
Globulin, Total: 2.4 g/dL (ref 1.5–4.5)
Glucose: 83 mg/dL (ref 70–99)
Potassium: 4.4 mmol/L (ref 3.5–5.2)
Sodium: 139 mmol/L (ref 134–144)
Total Protein: 7.1 g/dL (ref 6.0–8.5)
eGFR: 84 mL/min/1.73 (ref >59)

## 2024-02-07 LAB — CBC
Hematocrit: 38.9 % (ref 34.0–46.6)
Hemoglobin: 13 g/dL (ref 11.1–15.9)
MCH: 30 pg (ref 26.6–33.0)
MCHC: 33.4 g/dL (ref 31.5–35.7)
MCV: 90 fL (ref 79–97)
Platelets: 414 x10E3/uL (ref 150–450)
RBC: 4.34 x10E6/uL (ref 3.77–5.28)
RDW: 12.1 % (ref 11.7–15.4)
WBC: 6.5 x10E3/uL (ref 3.4–10.8)

## 2024-02-07 LAB — HEMOGLOBIN A1C
Est. average glucose Bld gHb Est-mCnc: 103 mg/dL
Hgb A1c MFr Bld: 5.2 % (ref 4.8–5.6)

## 2024-02-07 LAB — LIPID PANEL
Chol/HDL Ratio: 4.5 ratio — ABNORMAL HIGH (ref 0.0–4.4)
Cholesterol, Total: 239 mg/dL — ABNORMAL HIGH (ref 100–199)
HDL: 53 mg/dL (ref >39)
LDL Chol Calc (NIH): 164 mg/dL — ABNORMAL HIGH (ref 0–99)
Triglycerides: 124 mg/dL (ref 0–149)
VLDL Cholesterol Cal: 22 mg/dL (ref 5–40)

## 2024-02-08 DIAGNOSIS — N390 Urinary tract infection, site not specified: Secondary | ICD-10-CM | POA: Insufficient documentation

## 2024-02-08 LAB — URINE CULTURE

## 2024-02-08 MED ORDER — CIPROFLOXACIN HCL 250 MG PO TABS
250.0000 mg | ORAL_TABLET | Freq: Two times a day (BID) | ORAL | 0 refills | Status: AC
Start: 2024-02-08 — End: ?

## 2024-02-08 NOTE — Assessment & Plan Note (Signed)
 Chronic, now followed by Endocrinology. Will check thyroid panel and forward results to Dr. Talmage Nap for her review. Currently, she will continue with levothyroxineSynthroid daily.

## 2024-02-08 NOTE — Assessment & Plan Note (Signed)
 Recurrent UTIs with symptoms of abdominal cramping, pain, and urgency. Differential includes anatomical issues or atrophic vaginitis. Referral to urology considered. - Ordered urine culture. - Referred to urology for further evaluation.

## 2024-02-13 ENCOUNTER — Encounter: Payer: Self-pay | Admitting: Neurology

## 2024-02-13 ENCOUNTER — Telehealth: Payer: Self-pay | Admitting: Neurology

## 2024-02-13 ENCOUNTER — Ambulatory Visit: Admitting: Neurology

## 2024-02-13 NOTE — Telephone Encounter (Signed)
 Pt called to reschedule appt due to  missing it  it . 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

## 2024-02-15 ENCOUNTER — Encounter: Payer: 59 | Admitting: Internal Medicine

## 2024-02-27 ENCOUNTER — Ambulatory Visit: Admitting: Neurology

## 2024-03-07 ENCOUNTER — Other Ambulatory Visit: Payer: Self-pay | Admitting: Internal Medicine

## 2024-04-13 ENCOUNTER — Other Ambulatory Visit: Payer: Self-pay | Admitting: Neurology

## 2024-04-17 NOTE — Telephone Encounter (Signed)
 Last seen on 07/20/22 Follow up scheduled 09/26/23

## 2024-04-29 ENCOUNTER — Other Ambulatory Visit: Payer: Self-pay | Admitting: Internal Medicine

## 2024-08-06 ENCOUNTER — Ambulatory Visit: Admitting: Internal Medicine

## 2024-09-25 ENCOUNTER — Ambulatory Visit: Admitting: Neurology

## 2025-02-12 ENCOUNTER — Encounter: Admitting: Internal Medicine
